# Patient Record
Sex: Female | Born: 1935 | Race: White | Hispanic: No | Smoking: Never smoker
Health system: Southern US, Community
[De-identification: ages and names within clinical notes are randomized; demographics above are authoritative.]

## PROBLEM LIST (undated history)

## (undated) DIAGNOSIS — M810 Age-related osteoporosis without current pathological fracture: Secondary | ICD-10-CM

## (undated) DIAGNOSIS — E079 Disorder of thyroid, unspecified: Secondary | ICD-10-CM

## (undated) DIAGNOSIS — R413 Other amnesia: Secondary | ICD-10-CM

## (undated) DIAGNOSIS — I10 Essential (primary) hypertension: Secondary | ICD-10-CM

## (undated) DIAGNOSIS — E7800 Pure hypercholesterolemia, unspecified: Secondary | ICD-10-CM

## (undated) DIAGNOSIS — I2699 Other pulmonary embolism without acute cor pulmonale: Secondary | ICD-10-CM

## (undated) HISTORY — PX: ABDOMINAL HYSTERECTOMY: SHX81

## (undated) HISTORY — PX: CHOLECYSTECTOMY: SHX55

## (undated) HISTORY — PX: PARATHYROIDECTOMY: SHX19

---

## 1998-12-30 ENCOUNTER — Ambulatory Visit (HOSPITAL_COMMUNITY): Admission: RE | Admit: 1998-12-30 | Discharge: 1998-12-30 | Payer: Self-pay | Admitting: Ophthalmology

## 2007-07-01 ENCOUNTER — Emergency Department (HOSPITAL_COMMUNITY): Admission: EM | Admit: 2007-07-01 | Discharge: 2007-07-01 | Payer: Self-pay | Admitting: Emergency Medicine

## 2009-09-24 ENCOUNTER — Ambulatory Visit (HOSPITAL_COMMUNITY): Admission: RE | Admit: 2009-09-24 | Discharge: 2009-09-24 | Payer: Self-pay | Admitting: Internal Medicine

## 2010-01-26 ENCOUNTER — Emergency Department (HOSPITAL_COMMUNITY): Admission: EM | Admit: 2010-01-26 | Discharge: 2010-01-26 | Payer: Self-pay | Admitting: Emergency Medicine

## 2010-10-16 ENCOUNTER — Ambulatory Visit (HOSPITAL_COMMUNITY): Payer: Medicare Other | Attending: Internal Medicine

## 2010-10-16 DIAGNOSIS — M810 Age-related osteoporosis without current pathological fracture: Secondary | ICD-10-CM | POA: Insufficient documentation

## 2011-01-15 ENCOUNTER — Ambulatory Visit (HOSPITAL_COMMUNITY)
Admission: RE | Admit: 2011-01-15 | Discharge: 2011-01-15 | Disposition: A | Discharge status: Home / Self Care | Payer: Medicare Other | Source: Ambulatory Visit | Attending: Endocrinology | Admitting: Endocrinology

## 2011-01-15 ENCOUNTER — Other Ambulatory Visit (HOSPITAL_COMMUNITY): Payer: Self-pay | Admitting: Endocrinology

## 2011-01-15 DIAGNOSIS — M79609 Pain in unspecified limb: Secondary | ICD-10-CM | POA: Insufficient documentation

## 2011-01-15 DIAGNOSIS — R52 Pain, unspecified: Secondary | ICD-10-CM

## 2011-01-15 DIAGNOSIS — M7730 Calcaneal spur, unspecified foot: Secondary | ICD-10-CM | POA: Insufficient documentation

## 2012-01-22 ENCOUNTER — Emergency Department (HOSPITAL_COMMUNITY)
Admission: EM | Admit: 2012-01-22 | Discharge: 2012-01-22 | Disposition: A | Discharge status: Home / Self Care | Payer: Medicare Other | Attending: Emergency Medicine | Admitting: Emergency Medicine

## 2012-01-22 ENCOUNTER — Encounter (HOSPITAL_COMMUNITY): Payer: Self-pay | Admitting: Emergency Medicine

## 2012-01-22 ENCOUNTER — Emergency Department (HOSPITAL_COMMUNITY): Payer: Medicare Other

## 2012-01-22 DIAGNOSIS — W19XXXA Unspecified fall, initial encounter: Secondary | ICD-10-CM | POA: Insufficient documentation

## 2012-01-22 DIAGNOSIS — Z79899 Other long term (current) drug therapy: Secondary | ICD-10-CM | POA: Insufficient documentation

## 2012-01-22 DIAGNOSIS — R269 Unspecified abnormalities of gait and mobility: Secondary | ICD-10-CM | POA: Insufficient documentation

## 2012-01-22 DIAGNOSIS — Z7901 Long term (current) use of anticoagulants: Secondary | ICD-10-CM | POA: Insufficient documentation

## 2012-01-22 DIAGNOSIS — Z86711 Personal history of pulmonary embolism: Secondary | ICD-10-CM | POA: Insufficient documentation

## 2012-01-22 DIAGNOSIS — M545 Low back pain, unspecified: Secondary | ICD-10-CM | POA: Insufficient documentation

## 2012-01-22 DIAGNOSIS — E079 Disorder of thyroid, unspecified: Secondary | ICD-10-CM | POA: Insufficient documentation

## 2012-01-22 DIAGNOSIS — G319 Degenerative disease of nervous system, unspecified: Secondary | ICD-10-CM | POA: Insufficient documentation

## 2012-01-22 DIAGNOSIS — M25559 Pain in unspecified hip: Secondary | ICD-10-CM | POA: Insufficient documentation

## 2012-01-22 HISTORY — DX: Other pulmonary embolism without acute cor pulmonale: I26.99

## 2012-01-22 HISTORY — DX: Disorder of thyroid, unspecified: E07.9

## 2012-01-22 NOTE — ED Provider Notes (Signed)
History     CSN: [id number]  Arrival date & time 01/22/12  1000   First MD Initiated Contact with Patient 01/22/12 1023      Chief Complaint  Patient presents with  . Fall    (Consider location/radiation/quality/duration/timing/severity/associated sxs/prior treatment) HPI Patient presents with complaint of fall today. She states that she was walking to the bathroom and felt unsteady on her feet and this caused her to fall. She states she struck her head on a But denies having loss of consciousness. Her complaint is of pain in her lower back worse on the right side. She denies any chest pain fainting or palpitations or severe headache prior to the fall.  She has been able to bear weight since the fall.  Pain in back is worse with movement and palpation. She does take Coumadin. She denies any neck pain.  There are no other associated systemic symptoms, there are no other alleviating or modifying factors.   Past Medical History  Diagnosis Date  . Pulmonary emboli   . Thyroid disease     Past Surgical History  Procedure Date  . Parathyroidectomy   . Cholecystectomy   . Abdominal hysterectomy     No family history on file.  History  Substance Use Topics  . Smoking status: Never Smoker   . Smokeless tobacco: Never Used  . Alcohol Use: No    OB History    Grav Para Term Preterm Abortions TAB SAB Ect Mult Living                  Review of Systems ROS reviewed and all otherwise negative except for mentioned in HPI  Allergies  Review of patient's allergies indicates no known allergies.  Home Medications   Current Outpatient Rx  Name Route Sig Dispense Refill  . ACETAMINOPHEN 500 MG PO TABS Oral Take 1,000 mg by mouth every 6 (six) hours as needed. pain    . BUPROPION HCL ER (SR) 100 MG PO TB12 Oral Take 100 mg by mouth daily.    Marland Kitchen CALCIUM GLUCONATE 500 MG PO TABS Oral Take 500 mg by mouth 2 (two) times daily.    . DONEPEZIL HCL 10 MG PO TABS Oral Take 10 mg by mouth  at bedtime.    Marland Kitchen ESCITALOPRAM OXALATE 10 MG PO TABS Oral Take 10 mg by mouth daily.    Marland Kitchen GEMFIBROZIL 600 MG PO TABS Oral Take 600 mg by mouth 2 (two) times daily before a meal.    . LEVOTHYROXINE SODIUM 50 MCG PO TABS Oral Take 50 mcg by mouth daily.    . OCUVITE-LUTEIN PO CAPS Oral Take 2 capsules by mouth daily.    Marland Kitchen OMEPRAZOLE 20 MG PO CPDR Oral Take 20 mg by mouth daily.    Marland Kitchen POTASSIUM CHLORIDE CRYS ER 20 MEQ PO TBCR Oral Take 20 mEq by mouth 2 (two) times daily.    . TRAVOPROST (BAK FREE) 0.004 % OP SOLN Both Eyes Place 1 drop into both eyes at bedtime.    . TRIAMTERENE-HCTZ 37.5-25 MG PO TABS Oral Take 1 tablet by mouth daily.    Marland Kitchen VITAMIN B-12 500 MCG PO TABS Oral Take 500 mcg by mouth daily.    Marland Kitchen VITAMIN D (ERGOCALCIFEROL) 50000 UNITS PO CAPS Oral Take 50,000 Units by mouth every 7 (seven) days. sundays    . WARFARIN SODIUM 5 MG PO TABS Oral Take 2.5-5 mg by mouth daily. Takes 1/2 tablet(2.5mg ) daily except Friday takes 1 tablet(5mg )  BP 138/55  Pulse 73  Temp 97.7 F (36.5 C) (Oral)  Resp 18  SpO2 97% Vitals reviewed Physical Exam Physical Examination: General appearance - alert, well appearing, and in no distress Mental status - alert, oriented to person, place, and time Eyes - EOMI, no conjunctival injection Mouth - mucous membranes moist, pharynx normal without lesions Chest - clear to auscultation, no wheezes, rales or rhonchi, symmetric air entry Heart - normal rate, regular rhythm, normal S1, S2, no murmurs, rubs, clicks or gallops Abdomen - soft, nontender, nondistended, no masses or organomegaly Neurological - alert, oriented, normal speech, strength 5/5 in extremities x 4, sensation intact Back- no midline thoracic tenderness or cervical tenderness, mild lumbar tenderness to palpation, mild paraspinal tenderness on right side- also some pain over lying right posterior hip Musculoskeletal - some pain with palpation over posterior right hip, mild pain with ROM of  right hip, otherwise no joint tenderness, deformity or swelling Extremities - peripheral pulses normal, no pedal edema, no clubbing or cyanosis Skin - normal coloration and turgor, no rashes  ED Course  Procedures (including critical care time)  1:46 PM xray results and images reviewed by me- pt has no point tenderness at T11  Labs Reviewed - No data to display Dg Lumbar Spine Complete  01/22/2012  *RADIOLOGY REPORT*  Clinical Data: Fall with low back pain.  LUMBAR SPINE - COMPLETE 4+ VIEW  Comparison: None.  Findings: AP, lateral and oblique images of the lumbar spine were obtained.  Multilevel degenerative changes, particularly on the right side.  There is a compression fracture at T11.  The T11 vertebral body is dense and this could be a chronic fracture. Severe degenerative endplate changes at L1-L2.  IMPRESSION: Compression fracture of the T11 vertebral body.  The age of the fracture is unknown but could be chronic.  Multilevel degenerative changes in the lumbar spine.  Original Report Authenticated By: Richarda Overlie, M.D.   Dg Hip Complete Right  01/22/2012  *RADIOLOGY REPORT*  Clinical Data: Fall and right hip pain.  RIGHT HIP - COMPLETE 2+ VIEW  Comparison: None  Findings: Sclerosis of the pubic symphysis.  Symmetric appearance of the SI joints.  Pubic rami are intact.  The right hip is located without acute fracture.  Scattered phleboliths in the pelvis. There is a surgical clip within the pelvis.  IMPRESSION: No acute bony abnormality.  Original Report Authenticated By: Richarda Overlie, M.D.   Ct Head Wo Contrast  01/22/2012  *RADIOLOGY REPORT*  Clinical Data: Fall and hit head.  The patient is on blood thinners.  CT HEAD WITHOUT CONTRAST  Technique:  Contiguous axial images were obtained from the base of the skull through the vertex without contrast.  Comparison: None.  Findings: There is diffuse cerebral atrophy.  There is low density in the periventricular white matter suggesting chronic changes.  Marked atrophy of the cerebellum.  No acute bony abnormality. There is marked hyperostosis in the frontal bones. No evidence for acute hemorrhage, mass lesion, midline shift, hydrocephalus or large infarct.  IMPRESSION: No acute intracranial abnormality.  Diffuse atrophy and evidence for chronic small vessel ischemic changes.  Original Report Authenticated By: Richarda Overlie, M.D.     1. Fall   2. Low back pain       MDM  Pt presents with c/o low back pain and right hip pain after mechanical fall today.  xrays reveal prior T11 compression fracture which family states is old and patient is having no pain there today- otherwise  imaging is reassuring.  Head CT obtained due to patient being on coumadin, but no acute abnormality.  Discharged with strict return precautions.  Pt agreeable with plan.        Ethelda Chick, MD 01/23/12 1122

## 2012-01-22 NOTE — ED Notes (Signed)
Pt c/o mild nausea

## 2012-01-22 NOTE — ED Notes (Signed)
Pt fell this morning on way to bathroom, hit head on cabinet but was not knocked out. Pt is c/o pain in lower back, landed on knees but they only hurt d/t arthritis.

## 2012-02-09 ENCOUNTER — Other Ambulatory Visit (HOSPITAL_COMMUNITY): Payer: Self-pay | Admitting: *Deleted

## 2012-02-14 ENCOUNTER — Encounter (HOSPITAL_COMMUNITY)
Admission: RE | Admit: 2012-02-14 | Discharge: 2012-02-14 | Disposition: A | Discharge status: Home / Self Care | Payer: Medicare Other | Source: Ambulatory Visit | Attending: Internal Medicine | Admitting: Internal Medicine

## 2012-02-14 ENCOUNTER — Encounter (HOSPITAL_COMMUNITY): Payer: Self-pay

## 2012-02-14 DIAGNOSIS — M810 Age-related osteoporosis without current pathological fracture: Secondary | ICD-10-CM | POA: Insufficient documentation

## 2012-02-14 HISTORY — DX: Other amnesia: R41.3

## 2012-02-14 HISTORY — DX: Essential (primary) hypertension: I10

## 2012-02-14 HISTORY — DX: Pure hypercholesterolemia, unspecified: E78.00

## 2012-02-14 HISTORY — DX: Age-related osteoporosis without current pathological fracture: M81.0

## 2012-02-14 MED ORDER — ZOLEDRONIC ACID 5 MG/100ML IV SOLN
5.0000 mg | Freq: Once | INTRAVENOUS | Status: AC
  Administered 2012-02-14: 5 mg via INTRAVENOUS
  Filled 2012-02-14: qty 100

## 2012-02-14 MED ORDER — SODIUM CHLORIDE 0.9 % IV SOLN
Freq: Once | INTRAVENOUS | Status: AC
  Administered 2012-02-14: 250 mL via INTRAVENOUS

## 2013-08-31 ENCOUNTER — Encounter: Payer: Self-pay | Admitting: Podiatrist

## 2013-08-31 ENCOUNTER — Ambulatory Visit (INDEPENDENT_AMBULATORY_CARE_PROVIDER_SITE_OTHER): Payer: Commercial Managed Care - HMO | Admitting: Podiatrist

## 2013-08-31 VITALS — BP 138/80 | HR 91 | Resp 18 | Ht 62.0 in | Wt 165.0 lb

## 2013-08-31 DIAGNOSIS — M79609 Pain in unspecified limb: Secondary | ICD-10-CM

## 2013-08-31 DIAGNOSIS — B351 Tinea unguium: Secondary | ICD-10-CM

## 2013-08-31 NOTE — Progress Notes (Signed)
   Subjective:    Patient ID: Gail Schneider, female    DOB: [birth year], 80 y.o.   MRN: [id number] Pt presents with painful B/L hallux toenail Left > right, for about 6 week. Both 1st toenails are thick and discolored and the right is painful HPI    Review of Systems  HENT: Positive for hearing loss.        Macular degeneration, glaucoma and cataracts  All other systems reviewed and are negative.       Objective:   Physical Exam GENERAL APPEARANCE: Alert, conversant. No acute distress.  VASCULAR: Pedal pulses palpable at 1/4 DP and PT bilateral.  Capillary refill time is immediate to all digits,  Proximal to distal cooling it warm to warm.   NEUROLOGIC: sensation is intact epicritically and protectively to 5.07 monofilament at 5/5 sites bilateral.  Light touch is intact bilateral, vibratory sensation intact bilateral,  MUSCULOSKELETAL: acceptable muscle strength, tone and stability bilateral.  Intrinsic muscluature intact bilateral.   DERMATOLOGIC: Bilateral hallux nails are thickened, discolored, dystrophic, yellowed, ingrown on the medial and lateral nail borders and painful. No redness, swelling or paronychia is present. Otherwise skin color, texture, and turger are within normal limits.  No preulcerative lesions are seen, no interdigital maceration noted.  No open lesions present.  Remainder of Digital nails are asymptomatic.     Assessment & Plan:  Mycotic toenails x 2  Plan: I debrided the mycotic toenails. Recommended against any type of antifungal oral medication due to her age. She'll be seen back routinely for debridements. Of note I lightly debrided today due to her discomfort. Hopefully next time I can do more.  Debridement carried out without complication-- will return in 3 months or as needed

## 2013-11-30 ENCOUNTER — Ambulatory Visit (INDEPENDENT_AMBULATORY_CARE_PROVIDER_SITE_OTHER): Payer: Commercial Managed Care - HMO | Admitting: Podiatrist

## 2013-11-30 DIAGNOSIS — M79609 Pain in unspecified limb: Secondary | ICD-10-CM

## 2013-11-30 DIAGNOSIS — B351 Tinea unguium: Secondary | ICD-10-CM | POA: Diagnosis not present

## 2013-11-30 DIAGNOSIS — M79673 Pain in unspecified foot: Secondary | ICD-10-CM

## 2013-11-30 NOTE — Progress Notes (Signed)
   Subjective:    Patient ID: Gail Schneider, female    DOB: 09-26-1917, 80 y.o.   MRN: [id number]  HPI Pt is here for routine debride. No other issues at this moment   Review of Systems     Objective:   Physical Exam Patient is awake, alert, and oriented x 3.  In no acute distress.  Vascular status is intact with palpable pedal pulses at 2/4 DP and PT bilateral and capillary refill time within normal limits. Neurological sensation is also intact bilaterally via Semmes Weinstein monofilament at 5/5 sites. Light touch, vibratory sensation, Achilles tendon reflex is intact. Dermatological exam reveals thick, and painful bilateral hallux nails which are elongated.  Other nails are able to be trimmed by the patient.  otherwise skin color, turger and texture as normal. No open lesions present.  Musculature intact with dorsiflexion, plantarflexion, inversion, eversion.    Assessment & Plan:  Symptomatic mycotic toenails x 2  Plan:  Debridement of the mycotic toenails accomplished today without complication. She will return in 3 months or as needed for follow up.

## 2014-06-01 ENCOUNTER — Emergency Department (HOSPITAL_COMMUNITY)
Admission: EM | Admit: 2014-06-01 | Discharge: 2014-06-01 | Disposition: A | Discharge status: Home / Self Care | Payer: Medicare PPO | Attending: Emergency Medicine | Admitting: Emergency Medicine

## 2014-06-01 ENCOUNTER — Encounter (HOSPITAL_COMMUNITY): Payer: Self-pay | Admitting: Adult Health

## 2014-06-01 ENCOUNTER — Emergency Department (HOSPITAL_COMMUNITY): Payer: Medicare PPO

## 2014-06-01 DIAGNOSIS — W19XXXA Unspecified fall, initial encounter: Secondary | ICD-10-CM

## 2014-06-01 DIAGNOSIS — M25521 Pain in right elbow: Secondary | ICD-10-CM

## 2014-06-01 DIAGNOSIS — Z7982 Long term (current) use of aspirin: Secondary | ICD-10-CM | POA: Diagnosis not present

## 2014-06-01 DIAGNOSIS — I10 Essential (primary) hypertension: Secondary | ICD-10-CM | POA: Insufficient documentation

## 2014-06-01 DIAGNOSIS — Y998 Other external cause status: Secondary | ICD-10-CM | POA: Diagnosis not present

## 2014-06-01 DIAGNOSIS — Y92031 Bathroom in apartment as the place of occurrence of the external cause: Secondary | ICD-10-CM | POA: Insufficient documentation

## 2014-06-01 DIAGNOSIS — E782 Mixed hyperlipidemia: Secondary | ICD-10-CM | POA: Insufficient documentation

## 2014-06-01 DIAGNOSIS — R197 Diarrhea, unspecified: Secondary | ICD-10-CM | POA: Diagnosis not present

## 2014-06-01 DIAGNOSIS — M810 Age-related osteoporosis without current pathological fracture: Secondary | ICD-10-CM | POA: Diagnosis not present

## 2014-06-01 DIAGNOSIS — Z86711 Personal history of pulmonary embolism: Secondary | ICD-10-CM | POA: Diagnosis not present

## 2014-06-01 DIAGNOSIS — W01198A Fall on same level from slipping, tripping and stumbling with subsequent striking against other object, initial encounter: Secondary | ICD-10-CM | POA: Diagnosis not present

## 2014-06-01 DIAGNOSIS — E079 Disorder of thyroid, unspecified: Secondary | ICD-10-CM | POA: Diagnosis not present

## 2014-06-01 DIAGNOSIS — S5001XA Contusion of right elbow, initial encounter: Secondary | ICD-10-CM

## 2014-06-01 DIAGNOSIS — Y9389 Activity, other specified: Secondary | ICD-10-CM | POA: Diagnosis not present

## 2014-06-01 DIAGNOSIS — S59901A Unspecified injury of right elbow, initial encounter: Secondary | ICD-10-CM | POA: Diagnosis not present

## 2014-06-01 NOTE — ED Notes (Signed)
Pt family member at bedside. Pt alert and oriented, denies any other injuries

## 2014-06-01 NOTE — ED Provider Notes (Signed)
CSN: [id number]     Arrival date & time 06/01/14  1711 History  This chart was scribed for non-physician practitioner working with No att. providers found by Elveria Risingimelie Horne, ED Scribe. This patient was seen in room TR10C/TR10C and the patient's care was started at 7:19 PM.   Chief Complaint  Patient presents with  . Elbow Injury   The history is provided by the patient and a relative. No language interpreter was used.   HPI Comments: Gail Schneider is a 80 y.o. female who presents to the Emergency Department with right elbow injury incurred due to a fall two nights ago. Patient reports stumbling while on her way to bathroom and hitting her right elbow on a plastic container. Patient has been experiencing pain, bruising and continued swelling. Patient denies pain in her right shoulder. Daughter reports that the patient last received Tylenol four ago. Patient denies numbness, tingling, weakness, loss of sensation or function.  Past Medical History  Diagnosis Date  . Pulmonary emboli   . Thyroid disease   . Osteoporosis   . Memory impairment   . Hypertension   . Hypercholesteremia    Past Surgical History  Procedure Laterality Date  . Parathyroidectomy    . Cholecystectomy    . Abdominal hysterectomy     History reviewed. No pertinent family history. History  Substance Use Topics  . Smoking status: Never Smoker   . Smokeless tobacco: Never Used  . Alcohol Use: No   OB History    No data available     Review of Systems  Constitutional: Negative for chills.  Gastrointestinal: Positive for diarrhea.  Musculoskeletal: Positive for joint swelling and arthralgias.  Skin: Positive for color change.  Neurological: Negative for weakness and numbness.      Allergies  Review of patient's allergies indicates no known allergies.  Home Medications   Prior to Admission medications   Medication Sig Start Date End Date Taking? Authorizing Provider  acetaminophen (TYLENOL) 500 MG  tablet Take 1,000 mg by mouth every 6 (six) hours as needed. pain    Historical Provider, MD  aspirin 81 MG tablet Take 81 mg by mouth daily.    Historical Provider, MD  buPROPion (WELLBUTRIN SR) 100 MG 12 hr tablet Take 100 mg by mouth daily.    Historical Provider, MD  calcium gluconate 500 MG tablet Take 500 mg by mouth 2 (two) times daily.    Historical Provider, MD  diclofenac (FLECTOR) 1.3 % PTCH Place 1 patch onto the skin 2 (two) times daily.    Historical Provider, MD  donepezil (ARICEPT) 10 MG tablet Take 10 mg by mouth at bedtime.    Historical Provider, MD  escitalopram (LEXAPRO) 10 MG tablet Take 10 mg by mouth daily.    Historical Provider, MD  gemfibrozil (LOPID) 600 MG tablet Take 600 mg by mouth 2 (two) times daily before a meal.    Historical Provider, MD  levothyroxine (SYNTHROID, LEVOTHROID) 50 MCG tablet Take 50 mcg by mouth daily.    Historical Provider, MD  lidocaine (LIDODERM) 5 % Place 1 patch onto the skin daily. Remove & Discard patch within 12 hours or as directed by MD    Historical Provider, MD  multivitamin-lutein (OCUVITE-LUTEIN) CAPS Take 2 capsules by mouth daily.    Historical Provider, MD  nystatin (MYCOSTATIN) powder Apply topically 4 (four) times daily.    Historical Provider, MD  omeprazole (PRILOSEC) 20 MG capsule Take 20 mg by mouth daily.    Historical Provider, MD  potassium chloride SA (K-DUR,KLOR-CON) 20 MEQ tablet Take 20 mEq by mouth 2 (two) times daily.    Historical Provider, MD  Travoprost, BAK Free, (TRAVATAN) 0.004 % SOLN ophthalmic solution Place 1 drop into both eyes at bedtime.    Historical Provider, MD  triamcinolone cream (KENALOG) 0.1 % Apply 1 application topically 2 (two) times daily.    Historical Provider, MD  triamterene-hydrochlorothiazide (MAXZIDE-25) 37.5-25 MG per tablet Take 1 tablet by mouth daily.    Historical Provider, MD  vitamin B-12 (CYANOCOBALAMIN) 500 MCG tablet Take 500 mcg by mouth daily.    Historical Provider, MD   Vitamin D, Ergocalciferol, (DRISDOL) 50000 UNITS CAPS Take 50,000 Units by mouth every 7 (seven) days. sundays    Historical Provider, MD  zoledronic acid (RECLAST) 5 MG/100ML SOLN injection Inject 5 mg into the vein once.    Historical Provider, MD   Triage Vitals: BP 121/85 mmHg  Pulse 86  Temp(Src) 98.7 F (37.1 C) (Oral)  Resp 18  SpO2 96% Physical Exam  Constitutional: She is oriented to person, place, and time. She appears well-developed and well-nourished. No distress.  HENT:  Head: Normocephalic and atraumatic.  Eyes: EOM are normal.  Neck: Neck supple. No tracheal deviation present.  Cardiovascular: Normal rate.   Pulmonary/Chest: Effort normal. No respiratory distress.  Musculoskeletal: Normal range of motion.  Moderate amount of swelling and ecchymosis on right elbow. posteriorally with1 x 1 cm abrasion. No ob def. full active and passive range of motion without pain. Motor strength 5 out of 5 at shoulder, elbow, wrist. Radial pulse 2+. Capillary refill less than 2 seconds distally. Distal sensation intact.  Neurological: She is alert and oriented to person, place, and time.  Skin: Skin is warm and dry.  Psychiatric: She has a normal mood and affect. Her behavior is normal.  Nursing note and vitals reviewed.   ED Course  Procedures (including critical care time)  COORDINATION OF CARE: 5:00 AM- Discussed treatment plan with patient at bedside and patient agreed to plan.   Labs Review Labs Reviewed - No data to display  Imaging Review Dg Elbow Complete Right  06/01/2014   CLINICAL DATA:  Fall in bathroom 3 days ago  EXAM: RIGHT ELBOW - COMPLETE 3+ VIEW  COMPARISON:  None.  FINDINGS: No evidence of fracture of the ulna or humerus. The radial head demonstrates osteophytosis without evidence of fracture. No joint effusion.  IMPRESSION: No fracture or dislocation.   Electronically Signed   By: Genevive BiStewart  Edmunds M.D.   On: 06/01/2014 20:56     EKG Interpretation None       MDM   Final diagnoses:  Elbow pain, right  Elbow contusion, right, initial encounter    Patient here with swelling to her right elbow that is post fall 2 nights ago. Patient denying any loss of consciousness or other complaints. Patient neurovascularly intact, with mild tenderness noted on exam and in moderate amount of swelling noted on exam. Radiographs show no evidence of fracture of the ulnar humerus, radial head demonstrates osteophytosis without evidence of fracture. No obvious joint effusion noted. I discussed RICE therapy with patient, and encourage her to follow-up with orthopedics should her symptoms persist. I also encouraged patient to follow up with her primary care physician. I discussed return precautions with patient and patient was agreeable to this plan. I encouraged patient to call or return to the ER should she have any questions or concerns.  I personally performed the services described in this documentation,  which was scribed in my presence. The recorded information has been reviewed and is accurate.  BP 129/99 mmHg  Pulse 84  Temp(Src) 97.4 F (36.3 C) (Oral)  Resp 18  SpO2 95%  Signed,  Ladona Mow, PA-C 5:00 AM  Patient seen and discussed with Dr. Linwood Dibbles, M.D.   Monte Fantasia, PA-C 06/02/14 0500  Linwood Dibbles, MD 06/04/14 [phone]

## 2014-06-01 NOTE — ED Notes (Signed)
PA at the bedside.

## 2014-06-01 NOTE — Discharge Instructions (Signed)
Follow-up with your primary care doctor. Follow-up with orthopedics if your symptoms are persisting. Return to the ER if your symptoms are worsening, or if you develop any severe swelling, redness, warmth, high fever  Elbow Contusion An elbow contusion is a deep bruise of the elbow. Contusions are the result of an injury that caused bleeding under the skin. The contusion may turn blue, purple, or yellow. Minor injuries will give you a painless contusion, but more severe contusions may stay painful and swollen for a few weeks.  CAUSES  An elbow contusion comes from a direct force to that area, such as falling on the elbow. SYMPTOMS   Swelling and redness of the elbow.  Bruising of the elbow area.  Tenderness or soreness of the elbow. DIAGNOSIS  You will have a physical exam and will be asked about your history. You may need an X-ray of your elbow to look for a broken bone (fracture).  TREATMENT  A sling or splint may be needed to support your injury. Resting, elevating, and applying cold compresses to the elbow area are often the best treatments for an elbow contusion. Over-the-counter medicines may also be recommended for pain control. HOME CARE INSTRUCTIONS   Put ice on the injured area.  Put ice in a plastic bag.  Place a towel between your skin and the bag.  Leave the ice on for 15-20 minutes, 03-04 times a day.  Only take over-the-counter or prescription medicines for pain, discomfort, or fever as directed by your caregiver.  Rest your injured elbow until the pain and swelling are better.  Elevate your elbow to reduce swelling.  Apply a compression wrap as directed by your caregiver. This can help reduce swelling and motion. You may remove the wrap for sleeping, showers, and baths. If your fingers become numb, cold, or blue, take the wrap off and reapply it more loosely.  Use your elbow only as directed by your caregiver. You may be asked to do range of motion exercises. Do  them as directed.  See your caregiver as directed. It is very important to keep all follow-up appointments in order to avoid any long-term problems with your elbow, including chronic pain or inability to move your elbow normally. SEEK IMMEDIATE MEDICAL CARE IF:   You have increased redness, swelling, or pain in your elbow.  Your swelling or pain is not relieved with medicines.  You have swelling of the hand and fingers.  You are unable to move your fingers or wrist.  You begin to lose feeling in your hand or fingers.  Your fingers or hand become cold or blue. MAKE SURE YOU:   Understand these instructions.  Will watch your condition.  Will get help right away if you are not doing well or get worse. Document Released: 05/02/2006 Document Revised: 08/16/2011 Document Reviewed: 04/09/2011 [facility] Patient Information 2015 [place], [place]. This information is not intended to replace advice given to you by your health care provider. Make sure you discuss any questions you have with your health care provider.

## 2014-06-01 NOTE — ED Notes (Signed)
Presents with right elbow pain, bruising and swelling from a fall that occurred 2 nights ago. Arm is continuing to swell.pt has full range of motion. denis LOC, pt tripped. denis other injury.

## 2014-07-02 ENCOUNTER — Other Ambulatory Visit (HOSPITAL_COMMUNITY): Payer: Self-pay | Admitting: Internal Medicine

## 2014-07-08 ENCOUNTER — Ambulatory Visit (HOSPITAL_COMMUNITY)
Admission: RE | Admit: 2014-07-08 | Discharge: 2014-07-08 | Disposition: A | Discharge status: Home / Self Care | Payer: Medicare PPO | Source: Ambulatory Visit | Attending: Internal Medicine | Admitting: Internal Medicine

## 2014-07-08 DIAGNOSIS — M810 Age-related osteoporosis without current pathological fracture: Secondary | ICD-10-CM | POA: Diagnosis present

## 2014-07-08 MED ORDER — SODIUM CHLORIDE 0.9 % IV SOLN
INTRAVENOUS | Status: DC
  Administered 2014-07-08: 250 mL via INTRAVENOUS

## 2014-07-08 MED ORDER — ZOLEDRONIC ACID 5 MG/100ML IV SOLN
5.0000 mg | Freq: Once | INTRAVENOUS | Status: AC
  Administered 2014-07-08: 5 mg via INTRAVENOUS
  Filled 2014-07-08: qty 100

## 2014-07-08 NOTE — Discharge Instructions (Signed)
Drink fluids/water as tolerated over next 72hrs °Tylenol or Ibuprofen OTC as directed °Continue calcium and Vit D as directed by your MDZoledronic Acid injection (Paget's Disease, Osteoporosis) °What is this medicine? °ZOLEDRONIC ACID (ZOE le dron ik AS id) lowers the amount of calcium loss from bone. It is used to treat Paget's disease and osteoporosis in women. °This medicine may be used for other purposes; ask your health care provider or pharmacist if you have questions. °COMMON BRAND NAME(S): Reclast, Zometa °What should I tell my health care provider before I take this medicine? °They need to know if you have any of these conditions: °-aspirin-sensitive asthma °-cancer, especially if you are receiving medicines used to treat cancer °-dental disease or wear dentures °-infection °-kidney disease °-low levels of calcium in the blood °-past surgery on the parathyroid gland or intestines °-receiving corticosteroids like dexamethasone or prednisone °-an unusual or allergic reaction to zoledronic acid, other medicines, foods, dyes, or preservatives °-pregnant or trying to get pregnant °-breast-feeding °How should I use this medicine? °This medicine is for infusion into a vein. It is given by a health care professional in a hospital or clinic setting. °Talk to your pediatrician regarding the use of this medicine in children. This medicine is not approved for use in children. °Overdosage: If you think you have taken too much of this medicine contact a poison control center or emergency room at once. °NOTE: This medicine is only for you. Do not share this medicine with others. °What if I miss a dose? °It is important not to miss your dose. Call your doctor or health care professional if you are unable to keep an appointment. °What may interact with this medicine? °-certain antibiotics given by injection °-NSAIDs, medicines for pain and inflammation, like ibuprofen or naproxen °-some diuretics like bumetanide,  furosemide °-teriparatide °This list may not describe all possible interactions. Give your health care provider a list of all the medicines, herbs, non-prescription drugs, or dietary supplements you use. Also tell them if you smoke, drink alcohol, or use illegal drugs. Some items may interact with your medicine. °What should I watch for while using this medicine? °Visit your doctor or health care professional for regular checkups. It may be some time before you see the benefit from this medicine. Do not stop taking your medicine unless your doctor tells you to. Your doctor may order blood tests or other tests to see how you are doing. °Women should inform their doctor if they wish to become pregnant or think they might be pregnant. There is a potential for serious side effects to an unborn child. Talk to your health care professional or pharmacist for more information. °You should make sure that you get enough calcium and vitamin D while you are taking this medicine. Discuss the foods you eat and the vitamins you take with your health care professional. °Some people who take this medicine have severe bone, joint, and/or muscle pain. This medicine may also increase your risk for jaw problems or a broken thigh bone. Tell your doctor right away if you have severe pain in your jaw, bones, joints, or muscles. Tell your doctor if you have any pain that does not go away or that gets worse. °Tell your dentist and dental surgeon that you are taking this medicine. You should not have major dental surgery while on this medicine. See your dentist to have a dental exam and fix any dental problems before starting this medicine. Take good care of your teeth while on   this medicine. Make sure you see your dentist for regular follow-up appointments. °What side effects may I notice from receiving this medicine? °Side effects that you should report to your doctor or health care professional as soon as possible: °-allergic reactions  like skin rash, itching or hives, swelling of the face, lips, or tongue °-anxiety, confusion, or depression °-breathing problems °-changes in vision °-eye pain °-feeling faint or lightheaded, falls °-jaw pain, especially after dental work °-mouth sores °-muscle cramps, stiffness, or weakness °-trouble passing urine or change in the amount of urine °Side effects that usually do not require medical attention (report to your doctor or health care professional if they continue or are bothersome): °-bone, joint, or muscle pain °-constipation °-diarrhea °-fever °-hair loss °-irritation at site where injected °-loss of appetite °-nausea, vomiting °-stomach upset °-trouble sleeping °-trouble swallowing °-weak or tired °This list may not describe all possible side effects. Call your doctor for medical advice about side effects. You may report side effects to FDA at 1-800-FDA-1088. °Where should I keep my medicine? °This drug is given in a hospital or clinic and will not be stored at home. °NOTE: This sheet is a summary. It may not cover all possible information. If you have questions about this medicine, talk to your doctor, pharmacist, or health care provider. °© 2015, [employer]. (2012-11-06 10:03:48) ° °

## 2015-01-11 ENCOUNTER — Encounter (HOSPITAL_COMMUNITY): Payer: Self-pay | Admitting: *Deleted

## 2015-01-11 ENCOUNTER — Emergency Department (HOSPITAL_COMMUNITY): Payer: Medicare PPO

## 2015-01-11 ENCOUNTER — Inpatient Hospital Stay (HOSPITAL_COMMUNITY)
Admission: EM | Admit: 2015-01-11 | Discharge: 2015-01-14 | DRG: 312 | Disposition: A | Discharge status: Home Health | Payer: Medicare PPO | Attending: Family Medicine | Admitting: Family Medicine

## 2015-01-11 DIAGNOSIS — I10 Essential (primary) hypertension: Secondary | ICD-10-CM

## 2015-01-11 DIAGNOSIS — B964 Proteus (mirabilis) (morganii) as the cause of diseases classified elsewhere: Secondary | ICD-10-CM | POA: Diagnosis present

## 2015-01-11 DIAGNOSIS — M810 Age-related osteoporosis without current pathological fracture: Secondary | ICD-10-CM | POA: Diagnosis present

## 2015-01-11 DIAGNOSIS — E861 Hypovolemia: Secondary | ICD-10-CM | POA: Diagnosis present

## 2015-01-11 DIAGNOSIS — Z86711 Personal history of pulmonary embolism: Secondary | ICD-10-CM

## 2015-01-11 DIAGNOSIS — G309 Alzheimer's disease, unspecified: Secondary | ICD-10-CM | POA: Diagnosis present

## 2015-01-11 DIAGNOSIS — Z6827 Body mass index (BMI) 27.0-27.9, adult: Secondary | ICD-10-CM

## 2015-01-11 DIAGNOSIS — Z886 Allergy status to analgesic agent status: Secondary | ICD-10-CM | POA: Diagnosis not present

## 2015-01-11 DIAGNOSIS — H540 Blindness, both eyes: Secondary | ICD-10-CM | POA: Diagnosis present

## 2015-01-11 DIAGNOSIS — G8929 Other chronic pain: Secondary | ICD-10-CM | POA: Diagnosis present

## 2015-01-11 DIAGNOSIS — H3530 Unspecified macular degeneration: Secondary | ICD-10-CM | POA: Diagnosis present

## 2015-01-11 DIAGNOSIS — E871 Hypo-osmolality and hyponatremia: Secondary | ICD-10-CM | POA: Diagnosis present

## 2015-01-11 DIAGNOSIS — R627 Adult failure to thrive: Secondary | ICD-10-CM | POA: Diagnosis present

## 2015-01-11 DIAGNOSIS — Z888 Allergy status to other drugs, medicaments and biological substances status: Secondary | ICD-10-CM

## 2015-01-11 DIAGNOSIS — Z833 Family history of diabetes mellitus: Secondary | ICD-10-CM | POA: Diagnosis not present

## 2015-01-11 DIAGNOSIS — Z7982 Long term (current) use of aspirin: Secondary | ICD-10-CM

## 2015-01-11 DIAGNOSIS — M8580 Other specified disorders of bone density and structure, unspecified site: Secondary | ICD-10-CM | POA: Diagnosis present

## 2015-01-11 DIAGNOSIS — E869 Volume depletion, unspecified: Secondary | ICD-10-CM | POA: Diagnosis present

## 2015-01-11 DIAGNOSIS — R296 Repeated falls: Secondary | ICD-10-CM | POA: Diagnosis present

## 2015-01-11 DIAGNOSIS — N12 Tubulo-interstitial nephritis, not specified as acute or chronic: Secondary | ICD-10-CM | POA: Diagnosis present

## 2015-01-11 DIAGNOSIS — E780 Pure hypercholesterolemia: Secondary | ICD-10-CM | POA: Diagnosis present

## 2015-01-11 DIAGNOSIS — B952 Enterococcus as the cause of diseases classified elsewhere: Secondary | ICD-10-CM | POA: Diagnosis present

## 2015-01-11 DIAGNOSIS — E43 Unspecified severe protein-calorie malnutrition: Secondary | ICD-10-CM | POA: Insufficient documentation

## 2015-01-11 DIAGNOSIS — N390 Urinary tract infection, site not specified: Secondary | ICD-10-CM | POA: Diagnosis present

## 2015-01-11 DIAGNOSIS — I951 Orthostatic hypotension: Secondary | ICD-10-CM | POA: Diagnosis present

## 2015-01-11 DIAGNOSIS — I2699 Other pulmonary embolism without acute cor pulmonale: Secondary | ICD-10-CM | POA: Diagnosis present

## 2015-01-11 DIAGNOSIS — H9190 Unspecified hearing loss, unspecified ear: Secondary | ICD-10-CM | POA: Diagnosis present

## 2015-01-11 DIAGNOSIS — Z66 Do not resuscitate: Secondary | ICD-10-CM | POA: Diagnosis present

## 2015-01-11 DIAGNOSIS — Z79899 Other long term (current) drug therapy: Secondary | ICD-10-CM

## 2015-01-11 DIAGNOSIS — F0280 Dementia in other diseases classified elsewhere without behavioral disturbance: Secondary | ICD-10-CM | POA: Diagnosis present

## 2015-01-11 DIAGNOSIS — E876 Hypokalemia: Secondary | ICD-10-CM | POA: Diagnosis present

## 2015-01-11 DIAGNOSIS — R531 Weakness: Secondary | ICD-10-CM | POA: Diagnosis present

## 2015-01-11 DIAGNOSIS — E079 Disorder of thyroid, unspecified: Secondary | ICD-10-CM | POA: Diagnosis present

## 2015-01-11 DIAGNOSIS — E860 Dehydration: Secondary | ICD-10-CM | POA: Diagnosis present

## 2015-01-11 DIAGNOSIS — E0789 Other specified disorders of thyroid: Secondary | ICD-10-CM | POA: Diagnosis present

## 2015-01-11 LAB — CBC WITH DIFFERENTIAL/PLATELET
BASOS ABS: 0 10*3/uL (ref 0.0–0.1)
Basophils Relative: 0 % (ref 0–1)
EOS ABS: 0 10*3/uL (ref 0.0–0.7)
EOS PCT: 0 % (ref 0–5)
HEMATOCRIT: 36.8 % (ref 36.0–46.0)
Hemoglobin: 12.5 g/dL (ref 12.0–15.0)
LYMPHS ABS: 1.7 10*3/uL (ref 0.7–4.0)
Lymphocytes Relative: 22 % (ref 12–46)
MCH: 28.3 pg (ref 26.0–34.0)
MCHC: 34 g/dL (ref 30.0–36.0)
MCV: 83.4 fL (ref 78.0–100.0)
MONO ABS: 0.7 10*3/uL (ref 0.1–1.0)
MONOS PCT: 9 % (ref 3–12)
NEUTROS ABS: 5.3 10*3/uL (ref 1.7–7.7)
NEUTROS PCT: 69 % (ref 43–77)
PLATELETS: 304 10*3/uL (ref 150–400)
RBC: 4.41 MIL/uL (ref 3.87–5.11)
RDW: 13.1 % (ref 11.5–15.5)
WBC: 7.7 10*3/uL (ref 4.0–10.5)

## 2015-01-11 LAB — URINALYSIS, ROUTINE W REFLEX MICROSCOPIC
Bilirubin Urine: NEGATIVE
GLUCOSE, UA: NEGATIVE mg/dL
Hgb urine dipstick: NEGATIVE
Ketones, ur: NEGATIVE mg/dL
Nitrite: POSITIVE — AB
Protein, ur: NEGATIVE mg/dL
SPECIFIC GRAVITY, URINE: 1.009 (ref 1.005–1.030)
Urobilinogen, UA: 1 mg/dL (ref 0.0–1.0)
pH: 7.5 (ref 5.0–8.0)

## 2015-01-11 LAB — COMPREHENSIVE METABOLIC PANEL
ALBUMIN: 2.9 g/dL — AB (ref 3.5–5.0)
ALT: 13 U/L — ABNORMAL LOW (ref 14–54)
ANION GAP: 12 (ref 5–15)
AST: 29 U/L (ref 15–41)
Alkaline Phosphatase: 179 U/L — ABNORMAL HIGH (ref 38–126)
BILIRUBIN TOTAL: 1 mg/dL (ref 0.3–1.2)
BUN: 19 mg/dL (ref 6–20)
CHLORIDE: 89 mmol/L — AB (ref 101–111)
CO2: 29 mmol/L (ref 22–32)
Calcium: 8.9 mg/dL (ref 8.9–10.3)
Creatinine, Ser: 1.36 mg/dL — ABNORMAL HIGH (ref 0.44–1.00)
GFR calc Af Amer: 37 mL/min — ABNORMAL LOW (ref >60)
GFR calc non Af Amer: 32 mL/min — ABNORMAL LOW (ref >60)
Glucose, Bld: 146 mg/dL — ABNORMAL HIGH (ref 65–99)
Potassium: 2.8 mmol/L — ABNORMAL LOW (ref 3.5–5.1)
SODIUM: 130 mmol/L — AB (ref 135–145)
Total Protein: 7.5 g/dL (ref 6.5–8.1)

## 2015-01-11 LAB — URINE MICROSCOPIC-ADD ON

## 2015-01-11 LAB — I-STAT TROPONIN, ED: TROPONIN I, POC: 0 ng/mL (ref 0.00–0.08)

## 2015-01-11 MED ORDER — ENSURE ENLIVE PO LIQD
237.0000 mL | Freq: Two times a day (BID) | ORAL | Status: DC
  Administered 2015-01-12 – 2015-01-13 (×2): 237 mL via ORAL

## 2015-01-11 MED ORDER — SODIUM CHLORIDE 0.9 % IJ SOLN
3.0000 mL | Freq: Two times a day (BID) | INTRAMUSCULAR | Status: DC
  Administered 2015-01-11 – 2015-01-13 (×5): 3 mL via INTRAVENOUS

## 2015-01-11 MED ORDER — ASPIRIN 81 MG PO CHEW
81.0000 mg | CHEWABLE_TABLET | Freq: Every day | ORAL | Status: DC
  Administered 2015-01-12 – 2015-01-14 (×3): 81 mg via ORAL
  Filled 2015-01-11 (×6): qty 1

## 2015-01-11 MED ORDER — POTASSIUM CHLORIDE 20 MEQ/15ML (10%) PO SOLN
40.0000 meq | Freq: Once | ORAL | Status: AC
  Administered 2015-01-11: 40 meq via ORAL
  Filled 2015-01-11: qty 30

## 2015-01-11 MED ORDER — SODIUM CHLORIDE 0.9 % IV SOLN: INTRAVENOUS | Status: DC

## 2015-01-11 MED ORDER — SODIUM CHLORIDE 0.9 % IV BOLUS (SEPSIS)
1000.0000 mL | Freq: Once | INTRAVENOUS | Status: AC
  Administered 2015-01-11: 1000 mL via INTRAVENOUS

## 2015-01-11 MED ORDER — HEPARIN SODIUM (PORCINE) 5000 UNIT/ML IJ SOLN
5000.0000 [IU] | Freq: Three times a day (TID) | INTRAMUSCULAR | Status: DC
  Administered 2015-01-11 – 2015-01-14 (×8): 5000 [IU] via SUBCUTANEOUS
  Filled 2015-01-11 (×8): qty 1

## 2015-01-11 MED ORDER — MEMANTINE HCL ER 28 MG PO CP24
28.0000 mg | ORAL_CAPSULE | Freq: Every day | ORAL | Status: DC
  Administered 2015-01-11 – 2015-01-13 (×3): 28 mg via ORAL
  Filled 2015-01-11 (×4): qty 1

## 2015-01-11 MED ORDER — CEFTRIAXONE SODIUM 1 G IJ SOLR
1.0000 g | Freq: Once | INTRAMUSCULAR | Status: AC
  Administered 2015-01-11: 1 g via INTRAVENOUS
  Filled 2015-01-11: qty 10

## 2015-01-11 MED ORDER — LEVOTHYROXINE SODIUM 25 MCG PO TABS
50.0000 ug | ORAL_TABLET | Freq: Every day | ORAL | Status: DC
  Administered 2015-01-12 – 2015-01-14 (×3): 50 ug via ORAL
  Filled 2015-01-11 (×3): qty 2

## 2015-01-11 MED ORDER — SODIUM CHLORIDE 0.9 % IV SOLN
Freq: Once | INTRAVENOUS | Status: AC
  Administered 2015-01-11: 12:00:00 via INTRAVENOUS

## 2015-01-11 MED ORDER — DONEPEZIL HCL 5 MG PO TABS
10.0000 mg | ORAL_TABLET | Freq: Two times a day (BID) | ORAL | Status: DC
  Administered 2015-01-11 – 2015-01-14 (×6): 10 mg via ORAL
  Filled 2015-01-11 (×6): qty 2

## 2015-01-11 MED ORDER — ESCITALOPRAM OXALATE 10 MG PO TABS
5.0000 mg | ORAL_TABLET | Freq: Every day | ORAL | Status: DC
  Administered 2015-01-11 – 2015-01-13 (×3): 5 mg via ORAL
  Filled 2015-01-11 (×3): qty 1

## 2015-01-11 MED ORDER — ZOLEDRONIC ACID 5 MG/100ML IV SOLN: 5.0000 mg | Freq: Once | INTRAVENOUS | Status: DC

## 2015-01-11 MED ORDER — BUPROPION HCL ER (SR) 150 MG PO TB12
150.0000 mg | ORAL_TABLET | Freq: Every day | ORAL | Status: DC
  Administered 2015-01-12 – 2015-01-13 (×2): 150 mg via ORAL
  Filled 2015-01-11 (×4): qty 1

## 2015-01-11 MED ORDER — SODIUM CHLORIDE 0.9 % IV SOLN
INTRAVENOUS | Status: DC
  Administered 2015-01-11 – 2015-01-12 (×4): via INTRAVENOUS

## 2015-01-11 NOTE — ED Provider Notes (Signed)
CSN: [id number]     Arrival date & time 01/11/15  1049 History   First MD Initiated Contact with Patient 01/11/15 1052     Chief Complaint  Patient presents with  . Failure To Thrive     (Consider location/radiation/quality/duration/timing/severity/associated sxs/prior Treatment) HPI [place] is a 81 y.o. female with history of prior PE, thyroid disease, hypertension, mild dementia, blindness, presents to emergency department with family from home with complaint of multiple syncopal episodes, weakness, failure to thrive. Patient lives at home on her own house but family stated there someone at the house 24 7 with her. Patient in the last 3 weeks has had gradual decline in health and activity. At this time patient unable to ambulate, where before she was able to ambulate with a walker. Family assist patient from bed to the bathroom, and states each time they put her in the wheelchair to will her to the bathroom she has a syncopal episode. Family states "halfway to the bathroom she just goes limp." Family stated takes patient approximately minute to become alert again. Family stated her last 3 weeks patient has had decreased appetite. Patient reports nothing tastes good. She has decreased by mouth intake. She is also complaining of chronic right knee pain. Family report multiple falls but states that H time they would lower her to the ground so report no possibility of injuries or head trauma. Patient denies any complaints. Patient is DO NOT RESUSCITATE.  Past Medical History  Diagnosis Date  . Pulmonary emboli   . Thyroid disease   . Osteoporosis   . Memory impairment   . Hypertension   . Hypercholesteremia    Past Surgical History  Procedure Laterality Date  . Parathyroidectomy    . Cholecystectomy    . Abdominal hysterectomy     No family history on file. History  Substance Use Topics  . Smoking status: Never Smoker   . Smokeless tobacco: Never Used  . Alcohol Use: No    OB History    No data available     Review of Systems  Unable to perform ROS: Dementia  Constitutional: Positive for appetite change. Negative for fever.  Respiratory: Negative for cough.   Neurological: Positive for syncope and weakness.      Allergies  Boniva; Lipitor; and Nsaids  Home Medications   Prior to Admission medications   Medication Sig Start Date End Date Taking? Authorizing Provider  acetaminophen (TYLENOL) 500 MG tablet Take 1,000 mg by mouth every 6 (six) hours as needed. pain    Historical Provider, MD  aspirin 81 MG tablet Take 81 mg by mouth daily.    Historical Provider, MD  buPROPion (WELLBUTRIN SR) 100 MG 12 hr tablet Take 100 mg by mouth daily.    Historical Provider, MD  calcium gluconate 500 MG tablet Take 500 mg by mouth 2 (two) times daily.    Historical Provider, MD  diclofenac (FLECTOR) 1.3 % PTCH Place 1 patch onto the skin 2 (two) times daily.    Historical Provider, MD  donepezil (ARICEPT) 10 MG tablet Take 10 mg by mouth at bedtime.    Historical Provider, MD  escitalopram (LEXAPRO) 10 MG tablet Take 10 mg by mouth daily.    Historical Provider, MD  gemfibrozil (LOPID) 600 MG tablet Take 600 mg by mouth 2 (two) times daily before a meal.    Historical Provider, MD  levothyroxine (SYNTHROID, LEVOTHROID) 50 MCG tablet Take 50 mcg by mouth daily.    Historical Provider,  MD  lidocaine (LIDODERM) 5 % Place 1 patch onto the skin daily. Remove & Discard patch within 12 hours or as directed by MD    Historical Provider, MD  multivitamin-lutein (OCUVITE-LUTEIN) CAPS Take 2 capsules by mouth daily.    Historical Provider, MD  nystatin (MYCOSTATIN) powder Apply topically 4 (four) times daily.    Historical Provider, MD  omeprazole (PRILOSEC) 20 MG capsule Take 20 mg by mouth daily.    Historical Provider, MD  potassium chloride SA (K-DUR,KLOR-CON) 20 MEQ tablet Take 20 mEq by mouth 2 (two) times daily.    Historical Provider, MD  Travoprost, BAK Free,  (TRAVATAN) 0.004 % SOLN ophthalmic solution Place 1 drop into both eyes at bedtime.    Historical Provider, MD  triamcinolone cream (KENALOG) 0.1 % Apply 1 application topically 2 (two) times daily.    Historical Provider, MD  triamterene-hydrochlorothiazide (MAXZIDE-25) 37.5-25 MG per tablet Take 1 tablet by mouth daily.    Historical Provider, MD  vitamin B-12 (CYANOCOBALAMIN) 500 MCG tablet Take 500 mcg by mouth daily.    Historical Provider, MD  Vitamin D, Ergocalciferol, (DRISDOL) 50000 UNITS CAPS Take 50,000 Units by mouth every 7 (seven) days. sundays    Historical Provider, MD  zoledronic acid (RECLAST) 5 MG/100ML SOLN injection Inject 5 mg into the vein once.    Historical Provider, MD   BP 135/63 mmHg  Pulse 69  Resp 18  SpO2 99% Physical Exam  Constitutional: She appears well-developed and well-nourished. No distress.  HENT:  Head: Normocephalic.  Oral mucosa dry  Eyes: Conjunctivae are normal. Pupils are equal, round, and reactive to light.  Neck: Neck supple.  Cardiovascular: Normal rate, regular rhythm and normal heart sounds.   Pulmonary/Chest: Effort normal and breath sounds normal. No respiratory distress. She has no wheezes. She has no rales.  Abdominal: Soft. Bowel sounds are normal. She exhibits no distension. There is tenderness. There is no rebound.  Mild suprapubic tenderness  Musculoskeletal: She exhibits no edema.  Tenderness to palpation over right knee, limited range of motion of the right knee due to pain. No tenderness to palpation over her head. Full range of motion of bilateral hips  Neurological: She is alert. No cranial nerve deficit. Coordination normal.  5 out of 5 and equal grip strength bilaterally, moving all extremities.   Skin: Skin is warm and dry.  Psychiatric: She has a normal mood and affect. Her behavior is normal.  Nursing note and vitals reviewed.   ED Course  Procedures (including critical care time) Labs Review Labs Reviewed   COMPREHENSIVE METABOLIC PANEL - Abnormal; Notable for the following:    Sodium 130 (*)    Potassium 2.8 (*)    Chloride 89 (*)    Glucose, Bld 146 (*)    Creatinine, Ser 1.36 (*)    Albumin 2.9 (*)    ALT 13 (*)    Alkaline Phosphatase 179 (*)    GFR calc non Af Amer 32 (*)    GFR calc Af Amer 37 (*)    All other components within normal limits  URINALYSIS, ROUTINE W REFLEX MICROSCOPIC (NOT AT [facility]) - Abnormal; Notable for the following:    APPearance CLOUDY (*)    Nitrite POSITIVE (*)    Leukocytes, UA LARGE (*)    All other components within normal limits  URINE MICROSCOPIC-ADD ON - Abnormal; Notable for the following:    Squamous Epithelial / LPF FEW (*)    Bacteria, UA MANY (*)  All other components within normal limits  URINE CULTURE  CBC WITH DIFFERENTIAL/PLATELET  Rosezena Sensor, ED    Imaging Review Dg Chest 2 View  01/11/2015   CLINICAL DATA:  Weakness and shortness of breath. History of hypertension.  EXAM: CHEST  2 VIEW  COMPARISON:  None  FINDINGS: Cardiac silhouette is normal in size and configuration. The aorta is uncoiled. There is a small hiatal hernia. No mediastinal or hilar masses or convincing adenopathy. No lung consolidation or edema. Mild bronchitic changes noted in the perihilar and medial lung bases. No pleural effusion or pneumothorax. There is a moderate compression fracture at the thoracolumbar junction, likely old. Bones are demineralized.  IMPRESSION: No acute cardiopulmonary disease.   Electronically Signed   By: Amie Portland M.D.   On: 01/11/2015 12:23     EKG Interpretation None      MDM   Final diagnoses:  UTI (lower urinary tract infection)  Orthostatic hypotension   Patient with mild dementia at baseline according to family. Here with syncopal episodes upon sitting up and standing. She has had progressive decline in her activities, not eating or drinking much, feeling stayed just eating popsicles and drinking Coke. She has expressed  to them that she "does not want to live anymore." Patient is DO NOT RESUSCITATE, palliative care only. We'll get labs, urinalysis, will hydrate. Patient appears to be dry.  Patient is severely orthostatic, blood pressure drops from 130 systolic to 70 systolic upon sitting. Did not stand up. IV fluids running. Blood work pending.  Potassium 2.8, given 40 mEq by mouth. Creatinine elevated at 1.36. Patient continues receiving IV fluids. Her urine is positive for nitrites and large ascites with many bacteria, most likely UTI. Culture sent. Rocephin ordered. Patient has normal white blood cell count, her vital signs are normal, doubt sepsis. Constipation further care with family. They do want antibiotics and fluid resuscitation. Will admit.   Spoke with hospitalist and will admit.  Filed Vitals:   01/11/15 1057 01/11/15 1058 01/11/15 1326  BP:  135/63 117/78  Pulse:  69 71  Resp:  18 16  SpO2: 96% 99% 92%     Jaynie Crumble, PA-C 01/12/15 1955  Lyndal Pulley, MD 01/12/15 2309

## 2015-01-11 NOTE — ED Notes (Addendum)
Per EMS pt coming from home with c/o FTT x 3 weeks that is progressively getting worse, Pt had no PO intake x 3 weeks (according to family). Pt is responsive and alert as per her baseline, denies pain. Per EMS pt was orthostatic with SBP 110 laying to 60 palpated when sitting. Pt was given 500 ml NS en route

## 2015-01-11 NOTE — ED Notes (Signed)
Bed: WA17 Expected date:  Expected time:  Means of arrival:  Comments: EMS- Hypotension, weakness

## 2015-01-11 NOTE — H&P (Signed)
[facility] History and Physical  ZERAH HILYER BPZ:[id number] DOB: 27-Mar-1918 DOA: 01/11/2015  Referring physician: Eyvonne Mechanic PA PCP: Jerlyn Ly, MD  Specialists: none yet  80 ? 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-prior use to use a walker-passing out often at home over the past 3 weeks h/o is relayed to me by her daugther who is at the bedside as the patient has some element of dementia At baseline is able to use no assistive device and lives alone and is relatively independent but has a caregiver during the day  Over the past 3 weeks, increasing unsteadiness of gait, on 01/07/15 was not able to use walker and needed to use rolling wheelchair Less by mouth intake, more lethargic, and frequent syncopal episodes  Note that patient is on antihypertensives including diarrhetic's  Orthostatics in the ED showed a significant drop to 78/29  - Chills, - fever, - chest pain, + cough, - blurred vision or double vision -->she has had macular degeneration for 4-5 years and cannot see at all  She is also deaf   daughter relates that patient has not had any dark stool, tarry stool, emesis, rash, exposure to ill contacts No foul odor to urine   89 ? 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-prior use to use a walker-passing out often at home over the past 3 weeks h/o is relayed to me by her daugther who is at the bedside as the patient has some element of dementia At baseline is able to use no assistive device and lives alone and is relatively independent but has a  caregiver during the day  Orthostatics in the ED showed a significant drop to 78/29   3/52 H/o progressive debility  - Chills, - fever, - chest pain, + cough, - blurred vision or double vision -->she has had macular degeneration for 4-5 years and cannot see at all  She is also deaf   daughter relates that patient has not had any dark stool, tarry stool, emesis, rash, exposure to ill contacts No foul odor to urine   Labs on admit Na 130, Cl 89 k 2.8 [repalced with 40 meq in ED po] Alk phos was 140's Hb was wnl No wbc Ua perfomred via i/o cath was concerning for pyelonpehritis Cxr=no acute cardio-pulm disease   Past Medical History  Diagnosis Date  . Pulmonary emboli   . Thyroid disease   . Osteoporosis   . Memory impairment   . Hypertension   . Hypercholesteremia    Past Surgical History  Procedure Laterality Date  . Parathyroidectomy    . Cholecystectomy    . 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 Boniva [Ibandronic Acid] Other (See Comments)    Arm/leg pain  . Lipitor [Atorvastatin] Other (See Comments)    myalgia  . Nsaids     Due to blood thinners, for PE (OFF NOW)  Family History  Problem Relation Age of Onset  . Diabetes Mellitus II Mother     Prior to Admission medications   Medication Sig Start Date End Date Taking? Authorizing Provider  acetaminophen (TYLENOL) 500 MG tablet Take 1,000 mg by mouth every 6 (six) hours as needed. pain   Yes Historical Provider, MD  aspirin 81 MG tablet Take 81 mg by mouth daily.   Yes Historical Provider, MD  buPROPion (WELLBUTRIN SR) 150 MG 12 hr tablet Take 150 mg by mouth daily. 12/06/14  Yes Historical Provider, MD  calcium gluconate 500 MG tablet Take 500 mg by mouth 2 (two)  times daily.   Yes Historical Provider, MD  donepezil (ARICEPT) 10 MG tablet Take 10 mg by mouth 2 (two) times daily.    Yes Historical Provider, MD  escitalopram (LEXAPRO) 10 MG tablet Take 5 mg by mouth at bedtime.    Yes Historical Provider, MD  gemfibrozil (LOPID) 600 MG tablet Take 600 mg by mouth 2 (two) times daily before a meal.   Yes Historical Provider, MD  levothyroxine (SYNTHROID, LEVOTHROID) 50 MCG tablet Take 50 mcg by mouth daily.   Yes Historical Provider, MD  memantine (NAMENDA XR) 28 MG CP24 24 hr capsule Take 28 mg by mouth at bedtime.   Yes Historical Provider, MD  multivitamin-lutein (OCUVITE-LUTEIN) CAPS Take 2 capsules by mouth daily.   Yes Historical Provider, MD  omeprazole (PRILOSEC) 20 MG capsule Take 20 mg by mouth at bedtime.    Yes Historical Provider, MD  potassium chloride SA (K-DUR,KLOR-CON) 20 MEQ tablet Take 20 mEq by mouth daily with breakfast.    Yes Historical Provider, MD  triamcinolone cream (KENALOG) 0.1 % Apply 1 application topically 2 (two) times daily as needed (for rash).    Yes Historical Provider, MD  triamterene-hydrochlorothiazide (MAXZIDE-25) 37.5-25 MG per tablet Take 0.5 tablets by mouth at bedtime.    Yes Historical Provider, MD  vitamin B-12 (CYANOCOBALAMIN) 500 MCG tablet Take 250 mcg by mouth at bedtime.    Yes Historical Provider, MD  Vitamin D, Ergocalciferol, (DRISDOL) 50000 UNITS CAPS Take 50,000 Units by mouth every Sunday.    Yes Historical Provider, MD  zoledronic acid (RECLAST) 5 MG/100ML SOLN injection Inject 5 mg into the vein once.   Yes Historical Provider, MD   Physical Exam: Filed Vitals:   01/11/15 1057 01/11/15 1058 01/11/15 1326  BP:  135/63 117/78  Pulse:  69 71  Resp:  18 16  SpO2: 96% 99% 92%   on exam   pleasant blind extremely hard of hearing Caucasian female   no pallor, no icterus, dry mucosa, poor dentition, no JVD, thick neck, N4-O2 holosystolic grade 4/6 murmur all over precordium  abdomen is soft nontender  nondistended no rebound no guarding   lower extremities show no edema or swelling Neurologically follows commands  Labs on Admission:  Basic Metabolic Panel:  Recent Labs Lab 01/11/15 1150  NA 130*  K 2.8*  CL 89*  CO2 29  GLUCOSE 146*  BUN 19  CREATININE 1.36*  CALCIUM 8.9   Liver Function Tests:  Recent Labs Lab 01/11/15 1150  AST 29  ALT 13*  ALKPHOS 179*  BILITOT 1.0  PROT 7.5  ALBUMIN 2.9*   No results for input(s): LIPASE, AMYLASE in the last 168 hours. No results for input(s): AMMONIA in the last 168 hours. CBC:  Recent Labs Lab 01/11/15 1150  WBC 7.7  NEUTROABS 5.3  HGB 12.5  HCT 36.8  MCV 83.4  PLT 304   Cardiac Enzymes: No results for input(s): CKTOTAL, CKMB, CKMBINDEX, TROPONINI in the last 168 hours.  BNP (last 3 results) No results for input(s): BNP in the last 8760 hours.  ProBNP (last 3 results) No results for input(s): PROBNP in the last 8760 hours.  CBG: No results for input(s): GLUCAP in the last 168 hours.  Radiological Exams on Admission: Dg Chest 2 View  01/11/2015   CLINICAL DATA:  Weakness and shortness of breath. History of hypertension.  EXAM: CHEST  2 VIEW  COMPARISON:  None  FINDINGS: Cardiac silhouette is normal in size and configuration. The aorta is uncoiled. There is a small hiatal hernia. No mediastinal or hilar masses or convincing adenopathy. No lung consolidation or edema. Mild bronchitic changes noted in the perihilar and medial lung bases. No pleural effusion or pneumothorax. There is a moderate compression fracture at the thoracolumbar junction, likely old. Bones are demineralized.  IMPRESSION: No acute cardiopulmonary disease.   Electronically Signed   By: Lajean Manes M.D.   On: 01/11/2015 12:23    EKG: Independently reviewed. Sinus rhythm, PR interval 0.16  QRS axis left anterior fascicular block  No ST-T wave changes    essment/Plan Principal Problem:   Syncope due to orthostatic hypotension -Etiology  secondary to continued use of diuretic HCTZ in setting of poor by mouth intake -Volume replete 1 25 cc an hour for 24 hours -Hold antihypertensives medication  Active Problems:   Orthostatic hypotension Volume depletion   Hypovolemia dehydration -See above discussion -Has hypovolemic hyponatremia -Could replace relatively aggressively as this is not a chronic hyponatremia therefore 125 cc hours appropriate -Repeat labs in a.m. -Consider Foley catheter although family has requested not placement at present time      Lesion of thyroid gland, parathyroidectomy in the 80s? 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 -Reorient frequently   Osteoporosis/osteopenia  -Monitor  -Age-appropriate DEXA screening as when necessary   Polypharmacy in the elderly Some medications on the beers criteria  I have held the following meds on her for the time being and primary care physician to determine best with forward - acetaminophen (TYLENOL) 500 MG tablet  - calcium gluconate 500 MG tablet  - gemfibrozil (LOPID) 600 MG tablet  - multivitamin-lutein (OCUVITE-LUTEIN) CAPS  - omeprazole (PRILOSEC) 20 MG capsule  - potassium chloride SA (K-DUR,KLOR-CON) 20 MEQ tablet  - triamcinolone cream (KENALOG) 0.1 %  - triamterene-hydrochlorothiazide (MAXZIDE-25) 37.5-25 MG per tablet  - vitamin B-12 (CYANOCOBALAMIN) 500 MCG tablet  - Vitamin D, Ergocalciferol, (DRISDOL) 50000 UNITS CAPS  Appt with PCP:  Code Status: DNR confirmed at bedside with family Family Communication: Discussed with daughter in  detail Disposition Plan: SNF versus 24-hour supervision at home-awaiting PT OT evaluation  DVT prophylaxis: SCD Consultants:  Procedures:    Time spent: 20  Verlon Au [facility] Pager [phone]  If 7PM-7AM, please contact night-coverage [email or web address] Password [facility] 01/11/2015, 3:33 PM

## 2015-01-11 NOTE — Progress Notes (Signed)
Received pt from ED being evaluated for syncopal episode due to orthostatic hypotension and UTI. Pt family reports a three week history of declining health. Symptoms included weakness, decreased appetite and eventually not being able to stand up or walk. No report of fever, nausea or vomiting. Pt usually use cane to ambulate at home but progressed to walker and then to wheel chair. Pt received 2L NS in the ED, Rocephin IV and 40 KCL PO for a K of 2.8. Pt is blind and hard of hearing. Pt has two daughters that cares for her. Pt is DNR.

## 2015-01-11 NOTE — ED Notes (Signed)
Dr Samtani at bedside 

## 2015-01-11 NOTE — ED Notes (Signed)
Nurse drawing labs. 

## 2015-01-12 LAB — CBC
HEMATOCRIT: 34.1 % — AB (ref 36.0–46.0)
HEMOGLOBIN: 11.6 g/dL — AB (ref 12.0–15.0)
MCH: 28.9 pg (ref 26.0–34.0)
MCHC: 34 g/dL (ref 30.0–36.0)
MCV: 84.8 fL (ref 78.0–100.0)
PLATELETS: 256 10*3/uL (ref 150–400)
RBC: 4.02 MIL/uL (ref 3.87–5.11)
RDW: 13.3 % (ref 11.5–15.5)
WBC: 6.9 10*3/uL (ref 4.0–10.5)

## 2015-01-12 LAB — COMPREHENSIVE METABOLIC PANEL
ALK PHOS: 147 U/L — AB (ref 38–126)
ALT: 10 U/L — ABNORMAL LOW (ref 14–54)
ANION GAP: 9 (ref 5–15)
AST: 21 U/L (ref 15–41)
Albumin: 2.7 g/dL — ABNORMAL LOW (ref 3.5–5.0)
BILIRUBIN TOTAL: 0.7 mg/dL (ref 0.3–1.2)
BUN: 12 mg/dL (ref 6–20)
CO2: 27 mmol/L (ref 22–32)
Calcium: 7.7 mg/dL — ABNORMAL LOW (ref 8.9–10.3)
Chloride: 99 mmol/L — ABNORMAL LOW (ref 101–111)
Creatinine, Ser: 1 mg/dL (ref 0.44–1.00)
GFR calc non Af Amer: 47 mL/min — ABNORMAL LOW (ref >60)
GFR, EST AFRICAN AMERICAN: 54 mL/min — AB (ref >60)
Glucose, Bld: 96 mg/dL (ref 65–99)
POTASSIUM: 2.8 mmol/L — AB (ref 3.5–5.1)
Sodium: 135 mmol/L (ref 135–145)
Total Protein: 6.4 g/dL — ABNORMAL LOW (ref 6.5–8.1)

## 2015-01-12 LAB — PROTIME-INR
INR: 1.06 (ref 0.00–1.49)
Prothrombin Time: 14 seconds (ref 11.6–15.2)

## 2015-01-12 MED ORDER — POTASSIUM CHLORIDE 20 MEQ/15ML (10%) PO SOLN
40.0000 meq | Freq: Two times a day (BID) | ORAL | Status: DC
  Administered 2015-01-12 (×2): 40 meq via ORAL
  Filled 2015-01-12 (×3): qty 30

## 2015-01-12 MED ORDER — DEXTROSE 5 % IV SOLN
1.0000 g | INTRAVENOUS | Status: DC
  Administered 2015-01-12: 1 g via INTRAVENOUS
  Filled 2015-01-12: qty 10

## 2015-01-12 NOTE — Progress Notes (Signed)
Arlys John XBJ:[id number] DOB: January 17, 1918 DOA: 01/11/2015 PCP: Ezequiel Kayser, MD  Brief narrative: 45 ? 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-prior use to use a walker-passing out often at home over the past 3 weeks Admitted 01/11/15 with possible pyelonephritis as well as significant orthostatic hypotension  Past medical history-As per Problem list Chart reviewed as below-   Consultants:    Procedures:    Antibiotics:  ROcephin   Subjective   Much more oriented Had a sandwich and coffee Daughter bedside No Overnight issues NO fever nor chills   Objective    Interim History:   Telemetry:    Objective: Filed Vitals:   01/11/15 1326 01/11/15 1619 01/11/15 2202 01/12/15 0632  BP: 117/78 133/62 115/42 106/51  Pulse: 71 60 58 62  Temp:  97.8 F (36.6 C) 97.5 F (36.4 C) 97.4 F (36.3 C)  TempSrc:  Oral Oral Oral  Resp: Height:   (1.6 m)    Weight:  70.1 kg (154 lb 8.7 oz)    SpO2: 92%  98% 98%    Intake/Output Summary (Last 24 hours) at 01/12/15 0910 Last data filed at 01/12/15 0600  Gross per 24 hour  Intake   1875 ml  Output      0 ml  Net   1875 ml    Exam:  General: eomi ncat Cardiovascular: s1 s2 + Murmur Respiratory: clear no added sound Abdomen: soft nt nd no rebound Skin no le edema  Data Reviewed: Basic Metabolic Panel:  Recent Labs Lab 01/11/15 1150 01/12/15 0510  NA 130* 135  K 2.8* 2.8*  CL 89* 99*  CO2 29 27  GLUCOSE 146* 96  BUN 19 12  CREATININE 1.36* 1.00  CALCIUM 8.9 7.7*   Liver Function Tests:  Recent Labs Lab 01/11/15 1150 01/12/15 0510  AST 29 21  ALT 13* 10*  ALKPHOS 179* 147*  BILITOT 1.0 0.7  PROT 7.5 6.4*  ALBUMIN 2.9* 2.7*   No results for input(s):  LIPASE, AMYLASE in the last 168 hours. No results for input(s): AMMONIA in the last 168 hours. CBC:  Recent Labs Lab 01/11/15 1150 01/12/15 0510  WBC 7.7 6.9  NEUTROABS 5.3  --   HGB 12.5 11.6*  HCT 36.8 34.1*  MCV 83.4 84.8  PLT 304 256   Cardiac Enzymes: No results for input(s): CKTOTAL, CKMB, CKMBINDEX, TROPONINI in the last 168 hours. BNP: Invalid input(s): POCBNP CBG: No results for input(s): GLUCAP in the last 168 hours.  No results found for this or any previous visit (from the past 240 hour(s)).   Studies:              All Imaging reviewed and is as per above notation   Scheduled Meds: . aspirin  81 mg Oral Daily  . buPROPion  150 mg Oral Daily  . cefTRIAXone (ROCEPHIN)  IV  1 g Intravenous Q24H  . donepezil  10 mg Oral BID  . escitalopram  5 mg Oral QHS  . feeding supplement (ENSURE ENLIVE)  237 mL Oral BID BM  . heparin  5,000 Units Subcutaneous 3 times per day  . levothyroxine  50 mcg Oral QAC breakfast  . memantine  28 mg Oral QHS  . potassium chloride  40 mEq Oral BID  .  sodium chloride  3 mL Intravenous Q12H   Continuous Infusions: . sodium chloride 125 mL/hr at 01/12/15 0204     Assessment/Plan:   Syncope due to orthostatic hypotension -Etiology secondary to continued use of diuretic HCTZ in setting of poor by mouth intake -Volume replete 1 25 cc an hour for 24 hours-->745 cc/hr 8.7.16 -Hold antihypertensives medication   Orthostatic hypotension Volume depletion  Hypovolemia dehydration -See above discussion -Has hypovolemic hyponatremia 2/2 to poor po solute intake -Could replace relatively aggressively as this is not a chronic hyponatremia therefore 125 cc hours appropriate -Resolving from 130--135 on 8.7    Lesion of thyroid gland, parathyroidectomy in the 80s? -Unclear etiology -Continue pamidronate as an outpatient for hypercalcemia -Continue Synthroid -corrected ca in wnl range 8.7   Pulmonary emboli not on AC 2/2 to risk  falls -Remote history of the same at the time of parathyroidectomy [no details in Epic] -Monitor   Dementia in Alzheimer's disease-moderate-on Ache agents  -[OR] bleeding 1.66 with anticholinergic agents  -Has been on these medications for about 2-3 years  -Consider narrowing to Namenda monotherapy ? Per PC  Osteoporosis/osteopenia  -Monitor  -Age-appropriate DEXA screening as when necessary   Polypharmacy in the elderly Some medications on the beers criteria  I have held the following meds on her for the time being and primary care physician to determine best with forward - acetaminophen (TYLENOL) 500 MG tablet  - calcium gluconate 500 MG tablet  - gemfibrozil (LOPID) 600 MG tablet  - multivitamin-lutein (OCUVITE-LUTEIN) CAPS  - omeprazole (PRILOSEC) 20 MG capsule  - potassium chloride SA (K-DUR,KLOR-CON) 20 MEQ tablet  - triamcinolone cream (KENALOG) 0.1 %  - triamterene-hydrochlorothiazide (MAXZIDE-25) 37.5-25 MG per tablet - vitamin B-12 (CYANOCOBALAMIN) 500 MCG tablet    Appt with PCP: Requested Code Status: DNR Family Communication: family +-spoke with daughter who was in room Disposition Plan: home/snf pending PT eval DVT prophylaxis: SCD Consultants:   Pleas Koch, MD  [facility] Pager [phone] 01/12/2015, 9:10 AM    LOS: 1 day

## 2015-01-13 DIAGNOSIS — E43 Unspecified severe protein-calorie malnutrition: Secondary | ICD-10-CM | POA: Insufficient documentation

## 2015-01-13 LAB — CBC
HCT: 32.9 % — ABNORMAL LOW (ref 36.0–46.0)
Hemoglobin: 10.7 g/dL — ABNORMAL LOW (ref 12.0–15.0)
MCH: 27.4 pg (ref 26.0–34.0)
MCHC: 32.5 g/dL (ref 30.0–36.0)
MCV: 84.1 fL (ref 78.0–100.0)
PLATELETS: 248 10*3/uL (ref 150–400)
RBC: 3.91 MIL/uL (ref 3.87–5.11)
RDW: 13.3 % (ref 11.5–15.5)
WBC: 7.7 10*3/uL (ref 4.0–10.5)

## 2015-01-13 LAB — BASIC METABOLIC PANEL
ANION GAP: 8 (ref 5–15)
BUN: 10 mg/dL (ref 6–20)
CO2: 26 mmol/L (ref 22–32)
CREATININE: 0.94 mg/dL (ref 0.44–1.00)
Calcium: 7.4 mg/dL — ABNORMAL LOW (ref 8.9–10.3)
Chloride: 100 mmol/L — ABNORMAL LOW (ref 101–111)
GFR calc Af Amer: 58 mL/min — ABNORMAL LOW (ref >60)
GFR, EST NON AFRICAN AMERICAN: 50 mL/min — AB (ref >60)
GLUCOSE: 97 mg/dL (ref 65–99)
Potassium: 3 mmol/L — ABNORMAL LOW (ref 3.5–5.1)
SODIUM: 134 mmol/L — AB (ref 135–145)

## 2015-01-13 LAB — MAGNESIUM: MAGNESIUM: 1.4 mg/dL — AB (ref 1.7–2.4)

## 2015-01-13 MED ORDER — CEFUROXIME AXETIL 500 MG PO TABS
500.0000 mg | ORAL_TABLET | Freq: Two times a day (BID) | ORAL | Status: DC
  Administered 2015-01-13 – 2015-01-14 (×3): 500 mg via ORAL
  Filled 2015-01-13 (×5): qty 1

## 2015-01-13 MED ORDER — POTASSIUM CHLORIDE 20 MEQ/15ML (10%) PO SOLN: 40.0000 meq | Freq: Two times a day (BID) | ORAL | Status: DC

## 2015-01-13 MED ORDER — POTASSIUM CHLORIDE CRYS ER 20 MEQ PO TBCR
40.0000 meq | EXTENDED_RELEASE_TABLET | Freq: Two times a day (BID) | ORAL | Status: DC
  Administered 2015-01-13 – 2015-01-14 (×3): 40 meq via ORAL
  Filled 2015-01-13 (×3): qty 2

## 2015-01-13 MED ORDER — SODIUM CHLORIDE 0.9 % IV SOLN
INTRAVENOUS | Status: DC
  Administered 2015-01-13 (×2): via INTRAVENOUS

## 2015-01-13 MED ORDER — MIDODRINE HCL 2.5 MG PO TABS
2.5000 mg | ORAL_TABLET | Freq: Three times a day (TID) | ORAL | Status: DC
  Administered 2015-01-13 – 2015-01-14 (×4): 2.5 mg via ORAL
  Filled 2015-01-13 (×6): qty 1

## 2015-01-13 MED ORDER — NITROFURANTOIN MONOHYD MACRO 100 MG PO CAPS: 100.0000 mg | ORAL_CAPSULE | Freq: Two times a day (BID) | ORAL | Status: DC

## 2015-01-13 MED ORDER — CEFUROXIME AXETIL 500 MG PO TABS: 500.0000 mg | ORAL_TABLET | Freq: Two times a day (BID) | ORAL | Status: DC

## 2015-01-13 NOTE — Progress Notes (Signed)
Patient's Orthostatic BP 95/49 HR-72 Lying, 78/39, 76-sitting, unable to obtain standing BP, Dr. Mahala Menghini notified, orders written. Will continue to assess patient.

## 2015-01-13 NOTE — Care Management Important Message (Signed)
Important Message  Patient Details  Name: Gail Schneider MRN: [id number] Date of Birth: 19-Apr-1918   Medicare Important Message Given:  Yes-second notification given    Haskell Flirt 01/13/2015, 1:07 PMImportant Message  Patient Details  Name: Gail Schneider MRN: [id number] Date of Birth: 12/07/1917   Medicare Important Message Given:  Yes-second notification given    Haskell Flirt 01/13/2015, 1:07 PM

## 2015-01-13 NOTE — Progress Notes (Signed)
Initial Nutrition Assessment  DOCUMENTATION CODES:   Severe malnutrition in context of acute illness/injury  INTERVENTION:  - Continue Ensure Enlive BID - Encourage PO intakes - RD will continue to monitor for needs  NUTRITION DIAGNOSIS:   Inadequate oral intake related to poor appetite as evidenced by per patient/family report.  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 BMI indicates overweight status. Pt sleeping at time of visit and all information obtained from personal caregiver. Per chart review, pt has eaten 25% of all meals since admission. Visualized lunch tray with 3-4 bites of sandwich consumed.  PTA pt has had a poor appetite for 3-4 weeks and today's lunch intake was her usual intakes during that time frame. Prior to that she had a very good appetite and made sure to stay well hydrated. During the past 3-4 weeks she has needed much encouragement with food and drink intakes. Her daughter was mixing Boost with ice cream and pt did well with this for a short time but then began to refuse, for the most part.   Caregiver states that she has tried to eat during the same time as pt to offer support and encouragement in a more comfortable environment but pt still will not eat more than a few bites.  It is thought that she lost 15 lbs in the past 3-4 weeks which would indicate 9% body weight in this time frame which is significant.  Not meeting needs. Will  monitor for POC/GOC and needs related to this. Unable to perform physical assessment. Medications reviewed. Labs reviewed; Na: 134 mmol/L, K: 3 mmol/L, Cl: 100 mmol/L, Ca: 7.4 mg/dL, GFR: 50.  Diet Order:  Diet regular Room service appropriate?: Yes; Fluid consistency:: Thin  Skin:  Reviewed, no issues  Last BM:  8/5  Height:   Ht Readings from Last 1 Encounters:  01/11/15  (1.6 m)    Weight:   Wt Readings from Last 1 Encounters:  01/11/15 154 lb 8.7 oz (70.1 kg)    Ideal Body Weight:  52.27 kg (kg)  BMI:  Body mass index is 27.38 kg/(m^2).  Estimated Nutritional Needs:   Kcal:  1400-1600  Protein:  65-75 grams  Fluid:  2 L/day  EDUCATION NEEDS:   No education needs identified at this time     Trenton Gammon, RD, LDN Inpatient Clinical Dietitian Pager # [phone] After hours/weekend pager # [phone]

## 2015-01-13 NOTE — Clinical Social Work Note (Signed)
Clinical Social Work Assessment  Patient Details  Name: Gail Schneider MRN: [id number] Date of Birth: [birth year]  Date of referral:  01/13/15               Reason for consult:  Discharge Planning                Permission sought to share information with:  Family Supports Permission granted to share information::  Yes, Verbal Permission Granted  Name::     Primus Bravo  Agency::     Relationship::  daughter  Contact Information:  [phone]  Housing/Transportation Living arrangements for the past 2 months:  Single Family Home Source of Information:  Adult Children Patient Interpreter Needed:  None Criminal Activity/Legal Involvement Pertinent to Current Situation/Hospitalization:  No - Comment as needed Significant Relationships:  Adult Children Lives with:  Self (pt has 24 hour caregivers) Do you feel safe going back to the place where you live?  Yes Need for family participation in patient care:  Yes (Comment) (pt oriented to person only)  Care giving concerns:  Pt admitted from home with caregivers and pt daughter supportive in pt care. Pt requiring more assistance at this time and PT recommending SNF.    Social Worker assessment / plan:  CSW reviewed chart and noted PT recommendation for New SNF.   CSW visited pt bedside. Pt sleeping soundly at this time. Pt caregiver present at this time. CSW introduced self and explained role. Pt caregiver stated that pt primary decision maker is pt daughter, Juliene Pina and stated that pt daughter, Juliene Pina can be reached via telephone.  CSW contacted pt daughter, Myra via telephone. CSW introduced self and explained role. Pt daughter confirmed that pt caregivers provided pt 24 hour care. CSW discussed with pt daughter that PT evaluated pt and pt requiring additional assistance with ADL's and recommendation for short term rehab at [facility]. Pt daughter stated that pt daughter's would like for pt to remain at home with caregivers. CSW discussed Home  health services and pt daughter is agreeable to home health services, but does not want to place pt in a SNF at this time. CSW discussed with pt daughter that CSW will notify RNCM in order for RNCM to assist with setting up home health services.   CSW notified RNCM, Cookie McGibboney.   No further social work needs identified at this time.   CSW signing off.   Please re-consult if social work needs arise.   Employment status:  Retired Nurse, adult PT Recommendations:  [place] / Referral to community resources:  Other (Comment Required) (Referral to [facility] as pt daughter declined SNF)  Patient/Family's Response to care:  Pt sleeping at time of visit and per chart, oriented to person only. Pt daughters striving to keep pt at home for as long as possible and have caregivers arranged for pt 24 hours a day. Pt daughter agreeable to involving home health services.   Patient/Family's Understanding of and Emotional Response to Diagnosis, Current Treatment, and Prognosis:  Pt daughter eager to know when pt may be medically ready for discharge. Pt daughter feels that pt needs can continue to be met in the home with caregivers, daughters, and home health support.   Emotional Assessment Appearance:  Appears stated age Attitude/Demeanor/Rapport:  Unable to Assess (pt sleeping at time of visit) Affect (typically observed):  Unable to Assess (pt sleeping at this time) Orientation:  Oriented to Self Alcohol / Substance use:  Not Applicable  Psych involvement (Current and /or in the community):  No (Comment)  Discharge Needs  Concerns to be addressed:  Discharge Planning Concerns Readmission within the last 30 days:  No Current discharge risk:  Physical Impairment Barriers to Discharge:  No Barriers Identified   CSW signing off. Please re-consult if social work needs arise.    [place], [place], LCSW 01/13/2015, 3:35 PM [phone]

## 2015-01-13 NOTE — Evaluation (Signed)
Physical Therapy Evaluation Patient Details Name: Gail Schneider MRN: [id number] DOB: August 09, 1917 Today's Date: 01/13/2015   History of Present Illness  81 y.o. female with history of prior PE, thyroid disease, hypertension, mild dementia, blindness, admitted from home with complaint of multiple syncopal episodes, weakness, failure to thrive and found to have orthostatic hypotension.  Clinical Impression  Pt admitted with above diagnosis. Pt currently with functional limitations due to the deficits listed below (see PT Problem List).  Pt will benefit from skilled PT to increase their independence and safety with mobility to allow discharge to the venue listed below.  Pt up in recliner on arrival requesting return to supine.  Pt requiring increased assist for transfers and reporting dizziness.  No family present however caregiver reports pt requiring more assist at home lately and uncertain if other caregiver can provide current assist level.  Pt may need ST-SNF prior to return  Home.     Follow Up Recommendations SNF    Equipment Recommendations  None recommended by PT    Recommendations for Other Services       Precautions / Restrictions Precautions Precautions: Fall Precaution Comments: orthostatic hypotension      Mobility  Bed Mobility Overal bed mobility: Needs Assistance Bed Mobility: Sit to Supine       Sit to supine: +2 for physical assistance;Mod assist   General bed mobility comments: assist for LEs onto bed  Transfers Overall transfer level: Needs assistance Equipment used: Rolling walker (2 wheeled) Transfers: Sit to/from [employer] Sit to Stand: Mod assist Stand pivot transfers: Mod assist       General transfer comment: placed pt's hands on RW due to visual impairment, assist to rise and steady, difficulty pivoting, pt reporting increased dizziness as well, assisted out of recliner and back to bed  Ambulation/Gait                 Stairs            Wheelchair Mobility    Modified Rankin (Stroke Patients Only)       Balance                                             Pertinent Vitals/Pain Pain Assessment: No/denies pain    Home Living Family/patient expects to be discharged to:: Private residence   Available Help at Discharge: Personal care attendant;Family;Available 24 hours/day Type of Home: House         Home Equipment: Walker - 2 wheels;Cane - single point;Wheelchair - manual Additional Comments: per chart review    Prior Function Level of Independence: Needs assistance         Comments: caregiver in room reports pt typically supervision level, however recently requiring more assist      Hand Dominance        Extremity/Trunk Assessment   Upper Extremity Assessment: Generalized weakness           Lower Extremity Assessment: Generalized weakness         Communication   Communication: HOH;Other (comment) (hx of blindness)  Cognition Arousal/Alertness: Awake/alert Behavior During Therapy: Flat affect Overall Cognitive Status: Difficult to assess                      General Comments      Exercises        Assessment/Plan  PT Assessment Patient needs continued PT services  PT Diagnosis Difficulty walking;Generalized weakness   PT Problem List Decreased strength;Decreased activity tolerance;Decreased balance;Decreased mobility;Decreased knowledge of use of DME  PT Treatment Interventions DME instruction;Gait training;Functional mobility training;Patient/family education;Therapeutic activities;Therapeutic exercise;Balance training   PT Goals (Current goals can be found in the Care Plan section) Acute Rehab PT Goals PT Goal Formulation: With patient Time For Goal Achievement: 01/27/15 Potential to Achieve Goals: Good    Frequency Min 3X/week   Barriers to discharge        Co-evaluation               End of Session  Equipment Utilized During Treatment: Gait belt Activity Tolerance: Other (comment);Patient limited by fatigue (limited by dizziness) Patient left: in bed;with call bell/phone within reach;with bed alarm set Nurse Communication: Mobility status         Time: 1001-1010 PT Time Calculation (min) (ACUTE ONLY): 9 min   Charges:   PT Evaluation $Initial PT Evaluation Tier I: 1 Procedure     PT G Codes:        Aryelle Figg,KATHrine E 01/13/2015, 11:49 AM Zenovia Jarred, PT, DPT 01/13/2015 Pager: [phone]

## 2015-01-13 NOTE — Progress Notes (Signed)
Gail Schneider:[id number] DOB: [birth year] DOA: 01/11/2015 PCP: Ezequiel Kayser, MD  Brief narrative:  81 ? 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-prior use to use a walker-passing out often at home over the past 3 weeks Admitted 01/11/15 with possible pyelonephritis as well as significant orthostatic hypotension  Past medical history-As per Problem list Chart reviewed as below-   Consultants:    Procedures:    Antibiotics:  Rocephin   Subjective   Well  Some confusion overnight and apparently thinks that she is in a facility/motel She is hallucinating and seeing peopkle that are not there acc to daughter No n/v today tolerating some diet Mild abd discomfiort     Objective    Interim History:   Telemetry:    Objective: Filed Vitals:   01/12/15 0632 01/12/15 1340 01/12/15 2136 01/13/15 0528  BP: 106/51 102/42 126/99 124/50  Pulse: 62 70 70 71  Temp: 97.4 F (36.3 C) 97.8 F (36.6 C) 98.5 F (36.9 C) 98.1 F (36.7 C)  TempSrc: Oral Oral Oral Oral  Resp: 20 18 20 20   Height:      Weight:      SpO2: 98% 100% 99% 98%    Intake/Output Summary (Last 24 hours) at 01/13/15 0900 Last data filed at 01/13/15 0845  Gross per 24 hour  Intake 3016.25 ml  Output      0 ml  Net 3016.25 ml    Exam:  General: eomi ncat Cardiovascular: s1 s2 + Murmur Respiratory: clear no added sound Abdomen: sof tslightly tender lower quadrant Skin no le edema  Data Reviewed: Basic Metabolic Panel:  Recent Labs Lab 01/11/15 1150 01/12/15 0510 01/13/15 0425  NA 130* 135 134*  K 2.8* 2.8* 3.0*  CL 89* 99* 100*  CO2 29 27 26   GLUCOSE 146* 96 97  BUN 19 12 10   CREATININE 1.36* 1.00 0.94  CALCIUM 8.9 7.7* 7.4*   Liver Function Tests:  Recent  Labs Lab 01/11/15 1150 01/12/15 0510  AST 29 21  ALT 13* 10*  ALKPHOS 179* 147*  BILITOT 1.0 0.7  PROT 7.5 6.4*  ALBUMIN 2.9* 2.7*   No results for input(s): LIPASE, AMYLASE in the last 168 hours. No results for input(s): AMMONIA in the last 168 hours. CBC:  Recent Labs Lab 01/11/15 1150 01/12/15 0510 01/13/15 0425  WBC 7.7 6.9 7.7  NEUTROABS 5.3  --   --   HGB 12.5 11.6* 10.7*  HCT 36.8 34.1* 32.9*  MCV 83.4 84.8 84.1  PLT 304 256 248   Cardiac Enzymes: No results for input(s): CKTOTAL, CKMB, CKMBINDEX, TROPONINI in the last 168 hours. BNP: Invalid input(s): POCBNP CBG: No results for input(s): GLUCAP in the last 168 hours.  Recent Results (from the past 240 hour(s))  Urine culture     Status: None (Preliminary result)   Collection Time: 01/11/15 12:30 PM  Result Value Ref Range Status   Specimen Description URINE, CATHETERIZED  Final   Special Requests NONE  Final   Culture   Final    >=100,000 COLONIES/mL GRAM NEGATIVE RODS CULTURE REINCUBATED FOR BETTER GROWTH Performed at [facility]    Report Status PENDING  Incomplete     Studies:              All Imaging reviewed and is as  per above notation   Scheduled Meds: . aspirin  81 mg Oral Daily  . buPROPion  150 mg Oral Daily  . cefUROXime  500 mg Oral BID WC  . donepezil  10 mg Oral BID  . escitalopram  5 mg Oral QHS  . feeding supplement (ENSURE ENLIVE)  237 mL Oral BID BM  . heparin  5,000 Units Subcutaneous 3 times per day  . levothyroxine  50 mcg Oral QAC breakfast  . memantine  28 mg Oral QHS  . potassium chloride  40 mEq Oral BID  . sodium chloride  3 mL Intravenous Q12H   Continuous Infusions:     Assessment/Plan:   Syncope due to orthostatic hypotension -Etiology secondary to continued use of diuretic HCTZ in setting of poor by mouth intake -Volume replete 1 25 cc an hour for 24 hours-->75 cc/hr 8.7.16-->NS locked off 01/12/17 -rpt orthostatics today -HCTZ should be d/c off of  her med-list from here on  E. Coli pyelonephritis -Cultures pending from admit -Narrow ceftriaxone after 48 hrs to PO Ceftin 500 bid -Await final c/s   Orthostatic hypotension Volume depletion  Hypovolemic hyponatremia c dehydration -See above discussion -Has hypovolemic hyponatremia 2/2 to poor po solute intake -Fluid/sodium replaced aggressively-Saline lock IV -Resolving from 130--135 on 8.7    Lesion of thyroid gland, parathyroidectomy in the 80s? 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 Per PCP  Osteoporosis/osteopenia  -Monitor  -Age-appropriate DEXA screening as when necessary   Polypharmacy in the elderly Some medications on the beers criteria  I have held the following meds on her for the time being and primary care physician to determine best with forward - acetaminophen (TYLENOL) 500 MG tablet  - calcium gluconate 500 MG tablet  - gemfibrozil (LOPID) 600 MG tablet  - multivitamin-lutein (OCUVITE-LUTEIN) CAPS  - omeprazole (PRILOSEC) 20 MG capsule  - potassium chloride SA (K-DUR,KLOR-CON) 20 MEQ tablet  - triamcinolone cream (KENALOG) 0.1 %  - triamterene-hydrochlorothiazide (MAXZIDE-25) 37.5-25 MG per tablet - vitamin B-12 (CYANOCOBALAMIN) 500 MCG tablet    Code Status: DNR Family Communication: -spoke with daughter who was in room Disposition Plan:snf pending PT eval DVT prophylaxis: SCD Consultants:   Pleas Koch, MD  [facility] Pager [phone] 01/13/2015, 9:00  AM    LOS: 2 days

## 2015-01-13 NOTE — Progress Notes (Signed)
Re-assessed orthostatic vital signs. MD made aware. No new orders at this time. Continue to monitor, follow up with PT.

## 2015-01-14 LAB — URINE CULTURE: Culture: >=100000

## 2015-01-14 LAB — BASIC METABOLIC PANEL
Anion gap: 6 (ref 5–15)
BUN: 10 mg/dL (ref 6–20)
CALCIUM: 7 mg/dL — AB (ref 8.9–10.3)
CO2: 26 mmol/L (ref 22–32)
Chloride: 104 mmol/L (ref 101–111)
Creatinine, Ser: 0.79 mg/dL (ref 0.44–1.00)
GFR calc Af Amer: >60 mL/min (ref >60)
Glucose, Bld: 133 mg/dL — ABNORMAL HIGH (ref 65–99)
POTASSIUM: 3.7 mmol/L (ref 3.5–5.1)
Sodium: 136 mmol/L (ref 135–145)

## 2015-01-14 LAB — MAGNESIUM: Magnesium: 1.3 mg/dL — ABNORMAL LOW (ref 1.7–2.4)

## 2015-01-14 MED ORDER — POTASSIUM CHLORIDE 20 MEQ/15ML (10%) PO SOLN: 40.0000 meq | Freq: Two times a day (BID) | ORAL | Status: AC

## 2015-01-14 MED ORDER — MIDODRINE HCL 2.5 MG PO TABS: 2.5000 mg | ORAL_TABLET | Freq: Three times a day (TID) | ORAL | Status: DC

## 2015-01-14 MED ORDER — POTASSIUM CHLORIDE CRYS ER 20 MEQ PO TBCR: 40.0000 meq | EXTENDED_RELEASE_TABLET | Freq: Two times a day (BID) | ORAL | Status: DC

## 2015-01-14 MED ORDER — CEFUROXIME AXETIL 500 MG PO TABS: 500.0000 mg | ORAL_TABLET | Freq: Two times a day (BID) | ORAL | Status: DC

## 2015-01-14 NOTE — Progress Notes (Signed)
Spoke with pt's daughter Gail Schneider at bedside concerning HH needs. Gail Schneider states that a private duty company has already been arranged. Instructed her to call PCP after discharge for any Home Needs or questions.

## 2015-01-14 NOTE — Discharge Summary (Addendum)
Physician Discharge Summary  Gail Schneider HKV:[id number] DOB: 29-Oct-1917 DOA: 01/11/2015  PCP: Ezequiel Kayser, MD  Admit date: 01/11/2015 Discharge date: 01/14/2015  Time spent: 35 minutes  Recommendations for Outpatient Follow-up:  1. Medication changes on d/c  Order on Discharge  - cefUROXime (CEFTIN) 500 MG tablet --STop date 01/17/15 - midodrine (PROAMATINE) 2.5 MG tablet  - potassium chloride SA (K-DUR,KLOR-CON) 20 MEQ tablet 2. ortho stasis needs to be managed cautiously--consider increasing dose Midodrine in OP setting 3. -Recommend knee high TED hose 4. Consider simplifying Polypharmacy--some evidence to suggest Aricept long-term ineffective for Dementia, but Namenda reasonable long term 5. Would recommend intravascular fluid repletion of volume, about 2 liters of oral fluids a day 6. Complete Keflex course and frequent toileting as OP 7. Needs Electrolytes checked as OP.  Consider CBC as well 8. Will ask for [facility] to see patient  Discharge Diagnoses:  Principal Problem:   Syncope due to orthostatic hypotension Active Problems:   Orthostatic hypotension   Volume depletion   Hypovolemia dehydration   Lesion of thyroid gland   Pulmonary emboli not on AC 2/2 to risk falls   Dementia in Alzheimer's disease-moderate-on Ache agents    Hypertension goal BP (blood pressure) < 140/90   Protein-calorie malnutrition, severe   Discharge Condition: dnr  Diet recommendation: regular  Filed Weights   01/11/15 1619  Weight: 70.1 kg (154 lb 8.7 oz)    History of present illness:  63 ? 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-prior use to use a walker -passing out often at home over the past 3 weeks Admitted 01/11/15 with possible pyelonephritis as well as  significant orthostatic hypotension  Hospital Course:    Syncope due to orthostatic hypotension -Etiology secondary to continued use of diuretic HCTZ in setting of poor by mouth intake -Volume replete 1 25 cc an hour for 24 hours-->75 cc/hr 8.7.16-->NS locked off 01/12/17 -rpt orthostatics today -HCTZ should be d/c off of her med-list from here on  Enterococcus/Proteus pyelonephritis  -Narrow ceftriaxone after 48 hrs to PO Ceftin 500 bid -lab indicated sensitivities were good for use of Ceftin   Orthostatic hypotension Volume depletion  Hypovolemic hyponatremia c dehydration -See above discussion -Has hypovolemic hyponatremia 2/2 to poor po solute intake -Fluid/sodium replaced aggressively-Saline lock IV -Resolving  on 8.9 Gatorade as OP for solute    Lesion of thyroid gland, parathyroidectomy in the 80s? 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 Per PCP  Osteoporosis/osteopenia  -Monitor  -Age-appropriate DEXA screening as when necessary   Polypharmacy in the elderly Some medications on the beers criteria  I  held the following meds in-patient and primary care physician to determine best with forward - acetaminophen (TYLENOL) 500 MG tablet  - calcium gluconate 500 MG tablet  - gemfibrozil (LOPID) 600 MG tablet  - multivitamin-lutein (OCUVITE-LUTEIN) CAPS  -  omeprazole (PRILOSEC) 20 MG capsule  - potassium chloride SA (K-DUR,KLOR-CON) 20  MEQ tablet  - triamcinolone cream (KENALOG) 0.1 %  - triamterene-hydrochlorothiazide (MAXZIDE-25) 37.5-25 MG per tablet - vitamin B-12 (CYANOCOBALAMIN) 500 MCG tablet   Discharge Exam: Filed Vitals:   01/14/15 0614  BP: 131/71  Pulse: 68  Temp: 98.1 F (36.7 C)  Resp: 18    General: eomi ncat Cardiovascular: s1 s2 no m/r/g Respiratory: clear Much more oriented and alert tol 60% meals No cp   Discharge Instructions   Discharge Instructions    Diet - low sodium heart healthy    Complete by:  As directed      Increase activity slowly    Complete by:  As directed           Current Discharge Medication List    START taking these medications   Details  cefUROXime (CEFTIN) 500 MG tablet Take 1 tablet (500 mg total) by mouth 2 (two) times daily with a meal. Qty: 8 tablet, Refills: 0    midodrine (PROAMATINE) 2.5 MG tablet Take 1 tablet (2.5 mg total) by mouth 3 (three) times daily with meals. Qty: 90 tablet, Refills: 0    potassium chloride 20 MEQ/15ML (10%) SOLN Take 30 mLs (40 mEq total) by mouth 2 (two) times daily. Qty: 450 mL, Refills: 0      CONTINUE these medications which have NOT CHANGED   Details  acetaminophen (TYLENOL) 500 MG tablet Take 1,000 mg by mouth every 6 (six) hours as needed. pain    aspirin 81 MG tablet Take 81 mg by mouth daily.    buPROPion (WELLBUTRIN SR) 150 MG 12 hr tablet Take 150 mg by mouth daily. Refills: 0    calcium gluconate 500 MG tablet Take 500 mg by mouth 2 (two) times daily.    donepezil (ARICEPT) 10 MG tablet Take 10 mg by mouth 2 (two) times daily.     escitalopram (LEXAPRO) 10 MG tablet Take 5 mg by mouth at bedtime.     gemfibrozil (LOPID) 600 MG tablet Take 600 mg by mouth 2 (two) times daily before a meal.    levothyroxine (SYNTHROID, LEVOTHROID) 50 MCG tablet Take 50 mcg by mouth daily.    memantine (NAMENDA XR) 28 MG CP24 24 hr capsule Take 28 mg by mouth at bedtime.    multivitamin-lutein  (OCUVITE-LUTEIN) CAPS Take 2 capsules by mouth daily.    omeprazole (PRILOSEC) 20 MG capsule Take 20 mg by mouth at bedtime.     triamcinolone cream (KENALOG) 0.1 % Apply 1 application topically 2 (two) times daily as needed (for rash).     vitamin B-12 (CYANOCOBALAMIN) 500 MCG tablet Take 250 mcg by mouth at bedtime.     Vitamin D, Ergocalciferol, (DRISDOL) 50000 UNITS CAPS Take 50,000 Units by mouth every Sunday.     zoledronic acid (RECLAST) 5 MG/100ML SOLN injection Inject 5 mg into the vein See admin instructions. Yearly      STOP taking these medications     potassium chloride SA (K-DUR,KLOR-CON) 20 MEQ tablet      triamterene-hydrochlorothiazide (MAXZIDE-25) 37.5-25 MG per tablet        Allergies  Allergen Reactions  . Boniva [Ibandronic Acid] Other (See Comments)    Arm/leg pain  . Lipitor [Atorvastatin] Other (See Comments)    myalgia  . Nsaids     Due to blood thinners, for PE (OFF NOW)      The results of significant diagnostics from this hospitalization (including imaging,  microbiology, ancillary and laboratory) are listed below for reference.    Significant Diagnostic Studies: Dg Chest 2 View  01/11/2015   CLINICAL DATA:  Weakness and shortness of breath. History of hypertension.  EXAM: CHEST  2 VIEW  COMPARISON:  None  FINDINGS: Cardiac silhouette is normal in size and configuration. The aorta is uncoiled. There is a small hiatal hernia. No mediastinal or hilar masses or convincing adenopathy. No lung consolidation or edema. Mild bronchitic changes noted in the perihilar and medial lung bases. No pleural effusion or pneumothorax. There is a moderate compression fracture at the thoracolumbar junction, likely old. Bones are demineralized.  IMPRESSION: No acute cardiopulmonary disease.   Electronically Signed   By: Amie Portland M.D.   On: 01/11/2015 12:23    Microbiology: Recent Results (from the past 240 hour(s))  Urine culture     Status: None (Preliminary  result)   Collection Time: 01/11/15 12:30 PM  Result Value Ref Range Status   Specimen Description URINE, CATHETERIZED  Final   Special Requests NONE  Final   Culture   Final    >=100,000 COLONIES/mL GRAM NEGATIVE RODS 20,000 COLONIES/mL ENTEROCOCCUS SPECIES Performed at [facility]    Report Status PENDING  Incomplete     Labs: Basic Metabolic Panel:  Recent Labs Lab 01/11/15 1150 01/12/15 0510 01/13/15 0425 01/14/15 0423  NA 130* 135 134* 136  K 2.8* 2.8* 3.0* 3.7  CL 89* 99* 100* 104  CO2 29 27 26 26   GLUCOSE 146* 96 97 133*  BUN 19 12 10 10   CREATININE 1.36* 1.00 0.94 0.79  CALCIUM 8.9 7.7* 7.4* 7.0*  MG  --   --  1.4* 1.3*   Liver Function Tests:  Recent Labs Lab 01/11/15 1150 01/12/15 0510  AST 29 21  ALT 13* 10*  ALKPHOS 179* 147*  BILITOT 1.0 0.7  PROT 7.5 6.4*  ALBUMIN 2.9* 2.7*   No results for input(s): LIPASE, AMYLASE in the last 168 hours. No results for input(s): AMMONIA in the last 168 hours. CBC:  Recent Labs Lab 01/11/15 1150 01/12/15 0510 01/13/15 0425  WBC 7.7 6.9 7.7  NEUTROABS 5.3  --   --   HGB 12.5 11.6* 10.7*  HCT 36.8 34.1* 32.9*  MCV 83.4 84.8 84.1  PLT 304 256 248   Cardiac Enzymes: No results for input(s): CKTOTAL, CKMB, CKMBINDEX, TROPONINI in the last 168 hours. BNP: BNP (last 3 results) No results for input(s): BNP in the last 8760 hours.  ProBNP (last 3 results) No results for input(s): PROBNP in the last 8760 hours.  CBG: No results for input(s): GLUCAP in the last 168 hours.     SignedRhetta Mura  [facility] 01/14/2015, 8:46 AM      Order on Discharge  - cefUROXime (CEFTIN) 500 MG tablet - midodrine (PROAMATINE) 2.5 MG tablet - potassium chloride SA (K-DUR,KLOR-CON) 20 MEQ tablet

## 2015-04-16 ENCOUNTER — Telehealth: Payer: Self-pay | Admitting: *Deleted

## 2015-04-16 NOTE — Telephone Encounter (Signed)
Pt's dtr, Myra states pt has a thick toenail with redness around and has to [facility] she can set up an appt, but what to do til then.  I told Myra to soak in Epsom salt warm water, and cover with an antibiotic ointment bandaid until seen in office, but if redness worsens, swelling or drainage go to the ER or primary doctor.

## 2015-04-21 ENCOUNTER — Encounter: Payer: Self-pay | Admitting: Podiatry

## 2015-04-21 ENCOUNTER — Ambulatory Visit (INDEPENDENT_AMBULATORY_CARE_PROVIDER_SITE_OTHER): Payer: Medicare PPO | Admitting: Podiatry

## 2015-04-21 VITALS — BP 70/40 | HR 82 | Resp 16

## 2015-04-21 DIAGNOSIS — M79673 Pain in unspecified foot: Secondary | ICD-10-CM

## 2015-04-21 DIAGNOSIS — L600 Ingrowing nail: Secondary | ICD-10-CM | POA: Diagnosis not present

## 2015-04-21 NOTE — Patient Instructions (Addendum)

## 2015-04-22 ENCOUNTER — Telehealth: Payer: Self-pay | Admitting: *Deleted

## 2015-04-22 NOTE — Telephone Encounter (Signed)
Called patient at [phone] (Home #) to check to see how they were doing from their ingrown toenail procedure that was performed on Monday, April 21, 2015. Pt stated, "toe feeling okay".

## 2015-04-23 NOTE — Progress Notes (Signed)
Subjective:     Patient ID: Gail Schneider, female   DOB: 1917/10/08, 81 y.o.   MRN: [id number]  HPI patient states I've a painful left hallux nail on the border and it can be hard for me at times to wear shoe gear comfortably   Review of Systems  All other systems reviewed and are negative.      Objective:   Physical Exam  Constitutional: She is oriented to person, place, and time.  Cardiovascular: Intact distal pulses.   Musculoskeletal: Normal range of motion.  Neurological: She is oriented to person, place, and time.  Skin: Skin is warm and dry.  Nursing note and vitals reviewed.  neurovascular status was found to be diminished but intact with muscle strength reduced but normal for her age with patient having a thickened hallux nail left with incurvation of the border that makes it hard to wear shoe gear comfortably     Assessment:     Ingrown toenail deformity left hallux    Plan:     Reviewed this with patient and caregiver and recommended removal of the nail corner and allowing it to drain with the possibility for a permanent procedure but does not get better but we like to avoid this due to advanced age. I infiltrated the left hallux 60 mg I can Marcaine mixture remove the borders and cleaned out the corners and applied sterile dressing. Reappoint to recheck

## 2015-11-20 ENCOUNTER — Ambulatory Visit (INDEPENDENT_AMBULATORY_CARE_PROVIDER_SITE_OTHER): Payer: Medicare Other | Admitting: Podiatry

## 2015-11-20 ENCOUNTER — Encounter: Payer: Self-pay | Admitting: Podiatry

## 2015-11-20 VITALS — BP 146/87 | HR 75 | Resp 14

## 2015-11-20 DIAGNOSIS — L03032 Cellulitis of left toe: Secondary | ICD-10-CM

## 2015-11-20 DIAGNOSIS — L600 Ingrowing nail: Secondary | ICD-10-CM | POA: Diagnosis not present

## 2015-11-20 MED ORDER — CEPHALEXIN 500 MG PO CAPS: 500.0000 mg | ORAL_CAPSULE | Freq: Two times a day (BID) | ORAL | Status: DC

## 2015-11-20 NOTE — Progress Notes (Signed)
Subjective:     Patient ID: Gail Schneider, female   DOB: [birth year], 82 y.o.   MRN: [id number]  HPI this patient presents to the office with chief complaint of pain noted on the inside big toe left foot. She states that it a red, swollen and painful and hurts when she walks and wears her shoes. She was seen last November by Dr. Charlsie Merlesregal for a similar problem required anesthesia to her left big toe and the removal of the offending portion of nail. She healed. Following that, but the problem appears to have returned. She presents the office today for evaluation and treatment of this condition   Review of Systems     Objective:   Physical Exam GENERAL APPEARANCE: Alert, conversant. Appropriately groomed. No acute distress.  VASCULAR: Pedal pulses are  palpable at  [facility].  Capillary refill time is immediate to all digits,  Normal temperature gradient.  Digital hair growth is present bilateral  NEUROLOGIC: sensation is normal to 5.07 monofilament at 5/5 sites bilateral.  Light touch is intact bilateral, Muscle strength normal.  MUSCULOSKELETAL: acceptable muscle strength, tone and stability bilateral.  Intrinsic muscluature intact bilateral.  Rectus appearance of foot and digits noted bilateral.   DERMATOLOGIC: skin color, texture, and turgor are within normal limits.  No preulcerative lesions or ulcers  are seen, no interdigital maceration noted.  No open lesions present.  Digital nails are asymptomatic. No drainage noted.      Assessment:     Paronychia medial border left great toe  Ingrown nail medial border left great toe.     Plan:    ROV  Treatment options and alternatives discussed.  Recommended an incision and drainage and patient agreed.  Left hallux was prepped with alcohol and a 3cc. of  2% lidocaine plain was administered in a digital block fashion.  The toe was then prepped with betadine solution .  The offending nail border was then excised and all necrotic tissue was  resected.  The area was then cleansed  and antibiotic ointment and a dry sterile dressing was applied.  The patient was dispensed instructions for aftercare. Prescribed cephalexin #15.  Home soaks were given.  RTC 10 weeks for nail care.  Call if toe continues to be painful   Helane GuntherGregory Yavier Snider DPM

## 2015-12-12 ENCOUNTER — Other Ambulatory Visit (HOSPITAL_COMMUNITY): Payer: Self-pay | Admitting: *Deleted

## 2015-12-15 ENCOUNTER — Ambulatory Visit (HOSPITAL_COMMUNITY): Payer: Medicare PPO | Attending: Internal Medicine

## 2015-12-25 ENCOUNTER — Encounter: Payer: Self-pay | Admitting: Podiatry

## 2015-12-25 ENCOUNTER — Ambulatory Visit (INDEPENDENT_AMBULATORY_CARE_PROVIDER_SITE_OTHER): Payer: Medicare Other | Admitting: Podiatry

## 2015-12-25 VITALS — BP 128/76 | HR 82 | Resp 14

## 2015-12-25 DIAGNOSIS — L97521 Non-pressure chronic ulcer of other part of left foot limited to breakdown of skin: Secondary | ICD-10-CM | POA: Diagnosis not present

## 2015-12-25 MED ORDER — CEPHALEXIN 500 MG PO CAPS: 500.0000 mg | ORAL_CAPSULE | Freq: Two times a day (BID) | ORAL | Status: DC

## 2015-12-25 NOTE — Progress Notes (Signed)
Subjective:     Patient ID: Gail Schneider, female   DOB: Jul 29, 1917, [age] y.o.   MRN: [id number]  HPI this patient presents the office concerned about drainage coming from the big toe of the left foot. Patient previously had local anesthesia in nail removal performed by myself one month ago. She now has developed an open draining area on the big toe of the left foot. Patient states draining occurs and they have soaked and bandaged the site, but the drainage. She and her daughter are concerned about this draining lesion and presents the office for an evaluation and treatment   Review of Systems     Objective:   Physical Exam neurovascular status is same as in June. She has a red, inflamed ulcerated area on the dorsal aspect of the left foot. No swelling or streaking noted from the site. This ulcer is over the IPJ between the surgical site and the anesthesia area     Assessment:     Ulcer left hallux    Plan:     ROV  Prescribe cephalexin for local wound left hallux.  Neosporin/DSD.  Continue home soaks.  RTC prn   Helane GuntherGregory Raja Liska DPM

## 2015-12-25 NOTE — Addendum Note (Signed)
Addended by: Harlon FlorSOUTHERLAND, Tonique Mendonca L on: 12/25/2015 10:02 AM   Modules accepted: Orders

## 2016-01-29 ENCOUNTER — Ambulatory Visit: Payer: Medicare Other | Admitting: Podiatry

## 2016-06-14 ENCOUNTER — Emergency Department (HOSPITAL_COMMUNITY)
Admission: EM | Admit: 2016-06-14 | Discharge: 2016-06-14 | Disposition: A | Discharge status: Home / Self Care | Payer: Medicare Other | Attending: Emergency Medicine | Admitting: Emergency Medicine

## 2016-06-14 ENCOUNTER — Encounter (HOSPITAL_COMMUNITY): Payer: Self-pay

## 2016-06-14 ENCOUNTER — Emergency Department (HOSPITAL_COMMUNITY): Payer: Medicare Other

## 2016-06-14 DIAGNOSIS — R443 Hallucinations, unspecified: Secondary | ICD-10-CM | POA: Insufficient documentation

## 2016-06-14 DIAGNOSIS — R39198 Other difficulties with micturition: Secondary | ICD-10-CM | POA: Diagnosis present

## 2016-06-14 DIAGNOSIS — N390 Urinary tract infection, site not specified: Secondary | ICD-10-CM

## 2016-06-14 DIAGNOSIS — I10 Essential (primary) hypertension: Secondary | ICD-10-CM | POA: Diagnosis not present

## 2016-06-14 DIAGNOSIS — Z7982 Long term (current) use of aspirin: Secondary | ICD-10-CM | POA: Diagnosis not present

## 2016-06-14 DIAGNOSIS — Z79899 Other long term (current) drug therapy: Secondary | ICD-10-CM | POA: Insufficient documentation

## 2016-06-14 DIAGNOSIS — R410 Disorientation, unspecified: Secondary | ICD-10-CM | POA: Insufficient documentation

## 2016-06-14 LAB — CBC WITH DIFFERENTIAL/PLATELET
BASOS ABS: 0 10*3/uL (ref 0.0–0.1)
Basophils Relative: 0 %
Eosinophils Absolute: 0.1 10*3/uL (ref 0.0–0.7)
Eosinophils Relative: 1 %
HEMATOCRIT: 31.7 % — AB (ref 36.0–46.0)
Hemoglobin: 10.7 g/dL — ABNORMAL LOW (ref 12.0–15.0)
Lymphocytes Relative: 31 %
Lymphs Abs: 2.2 10*3/uL (ref 0.7–4.0)
MCH: 28.4 pg (ref 26.0–34.0)
MCHC: 33.8 g/dL (ref 30.0–36.0)
MCV: 84.1 fL (ref 78.0–100.0)
MONO ABS: 1 10*3/uL (ref 0.1–1.0)
Monocytes Relative: 14 %
Neutro Abs: 3.8 10*3/uL (ref 1.7–7.7)
Neutrophils Relative %: 54 %
Platelets: 192 10*3/uL (ref 150–400)
RBC: 3.77 MIL/uL — ABNORMAL LOW (ref 3.87–5.11)
RDW: 13.2 % (ref 11.5–15.5)
WBC: 7.2 10*3/uL (ref 4.0–10.5)

## 2016-06-14 LAB — COMPREHENSIVE METABOLIC PANEL
ALBUMIN: 2.6 g/dL — AB (ref 3.5–5.0)
ALT: 12 U/L — ABNORMAL LOW (ref 14–54)
ANION GAP: 9 (ref 5–15)
AST: 26 U/L (ref 15–41)
Alkaline Phosphatase: 68 U/L (ref 38–126)
BILIRUBIN TOTAL: 0.5 mg/dL (ref 0.3–1.2)
BUN: 12 mg/dL (ref 6–20)
CALCIUM: 8.3 mg/dL — AB (ref 8.9–10.3)
CO2: 28 mmol/L (ref 22–32)
Chloride: 96 mmol/L — ABNORMAL LOW (ref 101–111)
Creatinine, Ser: 0.77 mg/dL (ref 0.44–1.00)
GFR calc Af Amer: >60 mL/min (ref >60)
GFR calc non Af Amer: >60 mL/min (ref >60)
GLUCOSE: 115 mg/dL — AB (ref 65–99)
POTASSIUM: 3.5 mmol/L (ref 3.5–5.1)
Sodium: 133 mmol/L — ABNORMAL LOW (ref 135–145)
TOTAL PROTEIN: 6.8 g/dL (ref 6.5–8.1)

## 2016-06-14 LAB — URINALYSIS, ROUTINE W REFLEX MICROSCOPIC
BILIRUBIN URINE: NEGATIVE
Glucose, UA: NEGATIVE mg/dL
Hgb urine dipstick: NEGATIVE
KETONES UR: NEGATIVE mg/dL
Nitrite: NEGATIVE
PH: 6 (ref 5.0–8.0)
PROTEIN: NEGATIVE mg/dL
Specific Gravity, Urine: 1.009 (ref 1.005–1.030)

## 2016-06-14 LAB — I-STAT CG4 LACTIC ACID, ED: Lactic Acid, Venous: 1.11 mmol/L (ref 0.5–1.9)

## 2016-06-14 MED ORDER — CIPROFLOXACIN HCL 500 MG PO TABS: 500.0000 mg | ORAL_TABLET | Freq: Two times a day (BID) | ORAL | 0 refills | Status: DC

## 2016-06-14 MED ORDER — SODIUM CHLORIDE 0.9 % IV BOLUS (SEPSIS)
500.0000 mL | Freq: Once | INTRAVENOUS | Status: AC
  Administered 2016-06-14: 500 mL via INTRAVENOUS

## 2016-06-14 MED ORDER — DEXTROSE 5 % IV SOLN
1.0000 g | Freq: Once | INTRAVENOUS | Status: AC
  Administered 2016-06-14: 1 g via INTRAVENOUS
  Filled 2016-06-14: qty 10

## 2016-06-14 NOTE — ED Triage Notes (Signed)
Per EMS- patient has a hisory of dementia. Patient's daughter reports urinary retention and foul odor. Patient has a history of UTI's. Patient's daughter called [facility] and was told to come to the ED. Patient's daughter also reported that the patient ws more confused and has been having visual hallucinations.

## 2016-06-14 NOTE — ED Provider Notes (Signed)
[facility]-EMERGENCY DEPT Provider Note   CSN: [id number] Arrival date & time: 06/14/16  1721     History   Chief Complaint Chief Complaint  Patient presents with  . Urinary Tract Infection    HPI [place] is a 83 y.o. female.  Patient is a 83 year old female with a history of hypertension, hyperlipidemia, prior PE and dementia. She lives at home but has a caregiver during the day and her daughter lives with her at night. She is confused at baseline and history is obtained through the daughter. Her daughter states that over the last few days she's had increasing confusion. She seemed to be having some hallucinations. She's not eating or drinking as much as she normally does and she has decreasing urination. There is also a bit of an odor to her urine. The daughter's concern that she may have a UTI. She also has a little bit of cough and chest congestion. No known fevers. No vomiting. No diarrhea. She hasn't been complaining of abdominal pain. She hasn't complained of any chest pain or shortness of breath.      Past Medical History:  Diagnosis Date  . Hypercholesteremia   . Hypertension   . Memory impairment   . Osteoporosis   . Pulmonary emboli (HCC)   . Thyroid disease     Patient Active Problem List   Diagnosis Date Noted  . Protein-calorie malnutrition, severe (HCC) 01/13/2015  . Syncope due to orthostatic hypotension 01/11/2015  . Orthostatic hypotension 01/11/2015  . Volume depletion 01/11/2015  . Hypovolemia dehydration 01/11/2015  . Lesion of thyroid gland 01/11/2015  . Pulmonary emboli not on AC 2/2 to risk falls 01/11/2015  . 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 Hypertension goal BP (blood pressure) < 140/90 01/11/2015    Past Surgical History:  Procedure Laterality Date  . ABDOMINAL HYSTERECTOMY    . CHOLECYSTECTOMY    . PARATHYROIDECTOMY      OB History    No data available       Home Medications    Prior to  Admission medications   Medication Sig Start Date End Date Taking? Authorizing Provider  aspirin 81 MG tablet Take 81 mg by mouth daily.   Yes Historical Provider, MD  calcium citrate-vitamin D (CITRACAL+D) 315-200 MG-UNIT tablet Take 1 tablet by mouth 2 (two) times daily.   Yes Historical Provider, MD  donepezil (ARICEPT) 10 MG tablet Take 10 mg by mouth 2 (two) times daily.    Yes Historical Provider, MD  escitalopram (LEXAPRO) 20 MG tablet Take 10 mg by mouth at bedtime.  10/05/15  Yes Historical Provider, MD  gemfibrozil (LOPID) 600 MG tablet Take 600 mg by mouth 2 (two) times daily before a meal.   Yes Historical Provider, MD  levothyroxine (SYNTHROID, LEVOTHROID) 50 MCG tablet Take 50 mcg by mouth daily.   Yes Historical Provider, MD  memantine (NAMENDA XR) 28 MG CP24 24 hr capsule Take 28 mg by mouth at bedtime.   Yes Historical Provider, MD  multivitamin-lutein (OCUVITE-LUTEIN) CAPS Take 2 capsules by mouth daily.   Yes Historical Provider, MD  omeprazole (PRILOSEC) 20 MG capsule Take 20 mg by mouth at bedtime.    Yes Historical Provider, MD  potassium chloride (K-DUR) 10 MEQ tablet Take 10 mEq by mouth 3 (three) times a week.   Yes Historical Provider, MD  triamcinolone cream (KENALOG) 0.1 % Apply 1 application topically 2 (two) times daily as needed (for rash).    Yes Historical  Provider, MD  vitamin B-12 (CYANOCOBALAMIN) 500 MCG tablet Take 500 mcg by mouth at bedtime.    Yes Historical Provider, MD  Vitamin D, Ergocalciferol, (DRISDOL) 50000 UNITS CAPS Take 50,000 Units by mouth every Sunday.    Yes Historical Provider, MD  ciprofloxacin (CIPRO) 500 MG tablet Take 1 tablet (500 mg total) by mouth 2 (two) times daily. One po bid x 7 days 06/14/16   Rolan BuccoMelanie Caterra Ostroff, MD  potassium chloride 20 MEQ/15ML (10%) SOLN Take 30 mLs (40 mEq total) by mouth 2 (two) times daily. Patient not taking: Reported on 06/14/2016 01/14/15   Rhetta MuraJai-Gurmukh Samtani, MD    Family History Family History  Problem Relation  Age of Onset  . Diabetes Mellitus II Mother     Social History Social History  Substance Use Topics  . Smoking status: Never Smoker  . Smokeless tobacco: Never Used  . Alcohol use No     Allergies   Boniva [ibandronic acid]; Lipitor [atorvastatin]; and Nsaids   Review of Systems Review of Systems  Unable to perform ROS: Dementia     Physical Exam Updated Vital Signs BP (!) 140/46 (BP Location: Right Arm)   Pulse 65   Temp 98.1 F (36.7 C) (Oral)   Resp 18   Ht 5\' 3"  (1.6 m)   Wt 134 lb (60.8 kg)   SpO2 93%   BMI 23.74 kg/m   Physical Exam  Constitutional: She appears well-developed and well-nourished.  HENT:  Head: Normocephalic and atraumatic.  Slightly dry mucous membranes  Eyes: Pupils are equal, round, and reactive to light.  Neck: Normal range of motion. Neck supple.  Cardiovascular: Normal rate, regular rhythm and normal heart sounds.   Pulmonary/Chest: Effort normal and breath sounds normal. No respiratory distress. She has no wheezes. She has no rales. She exhibits no tenderness.  Abdominal: Soft. Bowel sounds are normal. There is no tenderness. There is no rebound and no guarding.  Musculoskeletal: Normal range of motion. She exhibits no edema.  Lymphadenopathy:    She has no cervical adenopathy.  Neurological: She is alert.  Patient is alert but confused. She moves all actually symmetrically without obvious focal deficits.  Skin: Skin is warm and dry. No rash noted.  Psychiatric: She has a normal mood and affect.     ED Treatments / Results  Labs (all labs ordered are listed, but only abnormal results are displayed) Labs Reviewed  COMPREHENSIVE METABOLIC PANEL - Abnormal; Notable for the following:       Result Value   Sodium 133 (*)    Chloride 96 (*)    Glucose, Bld 115 (*)    Calcium 8.3 (*)    Albumin 2.6 (*)    ALT 12 (*)    All other components within normal limits  CBC WITH DIFFERENTIAL/PLATELET - Abnormal; Notable for the  following:    RBC 3.77 (*)    Hemoglobin 10.7 (*)    HCT 31.7 (*)    All other components within normal limits  URINALYSIS, ROUTINE W REFLEX MICROSCOPIC - Abnormal; Notable for the following:    Leukocytes, UA MODERATE (*)    Bacteria, UA RARE (*)    Squamous Epithelial / LPF 0-5 (*)    All other components within normal limits  URINE CULTURE  I-STAT CG4 LACTIC ACID, ED    EKG  EKG Interpretation None       Radiology Dg Chest 2 View  Result Date: 06/14/2016 CLINICAL DATA:  Altered mental status EXAM: CHEST  2  VIEW COMPARISON:  01/11/2015 FINDINGS: Minimal atelectasis at the left lung base. No consolidation or effusion. Stable borderline cardiomegaly. Atherosclerosis. No pneumothorax. Surgical clips at the base of the neck. Stable moderate compression deformity of lower thoracic vertebra. IMPRESSION: Minimal basilar atelectasis on the left. No acute consolidation or infiltrate. Electronically Signed   By: Jasmine Pang M.D.   On: 06/14/2016 19:21    Procedures Procedures (including critical care time)  Medications Ordered in ED Medications  sodium chloride 0.9 % bolus 500 mL (500 mLs Intravenous New Bag/Given 06/14/16 2054)  cefTRIAXone (ROCEPHIN) 1 g in dextrose 5 % 50 mL IVPB (1 g Intravenous New Bag/Given 06/14/16 2054)     Initial Impression / Assessment and Plan / ED Course  I have reviewed the triage vital signs and the nursing notes.  Pertinent labs & imaging results that were available during my care of the patient were reviewed by me and considered in my medical decision making (see chart for details).  Clinical Course     Patient is a 83 year old female with mild increased confusion and hallucinations.  Patient does have evidence of a urinary tract infection. Her other labs are non-concerning. Chest x-ray is negative for infection. There is no evidence of sepsis. Patient's vital signs are stable. The daughter states that she is currently at her baseline mental status  and has not had any hallucinations today. I feel that she can be treated as an outpatient. There's been no vomiting or fevers. She was given dose Rocephin in the ED. She'll be started on Cipro. A urine culture was sent. She was advised to follow-up with her PCP. Return precautions were given.  Final Clinical Impressions(s) / ED Diagnoses   Final diagnoses:  Urinary tract infection without hematuria, site unspecified    New Prescriptions New Prescriptions   CIPROFLOXACIN (CIPRO) 500 MG TABLET    Take 1 tablet (500 mg total) by mouth 2 (two) times daily. One po bid x 7 days     Rolan Bucco, MD 06/14/16 2150

## 2016-06-16 LAB — URINE CULTURE

## 2016-07-26 ENCOUNTER — Encounter: Payer: Self-pay | Admitting: Podiatry

## 2016-07-26 ENCOUNTER — Ambulatory Visit (INDEPENDENT_AMBULATORY_CARE_PROVIDER_SITE_OTHER): Payer: Medicare Other | Admitting: Podiatry

## 2016-07-26 DIAGNOSIS — L603 Nail dystrophy: Secondary | ICD-10-CM | POA: Diagnosis not present

## 2016-07-27 NOTE — Progress Notes (Signed)
Subjective: Gail Schneider Since the office today with her daughter for her left toenail being severely thickened with causing pain. This has been ongoing for several years and she. She had an ingrown toenail procedure as well as individual being cut. The nail has become more thickened discolored and painful. She has noticed a small amount of pinkness around the toenail denies any drainage or pus or any red streaks. Denies any systemic complaints such as fevers, chills, nausea, vomiting. No acute changes since last appointment, and no other complaints at this time.   Objective: NAD DP/PT pulses palpable bilaterally, CRT less than 3 seconds The left hallux toenail is severely hypertrophic, dystrophic, discolored with yellow discoloration. There is tenderness and left toenail. The toenails brittle. There is no swelling erythema but there is pain discoloration to the skin. There is no drainage or pus in there is no ascending cellulitis. Upon debridement there is no underlying ulceration, drainage or any signs of infection. The skin discoloration is likely from inflammation due to the severe thickening. No edema, erythema, increase in warmth to bilateral lower extremities.  No open lesions or pre-ulcerative lesions.  No pain with calf compression, swelling, warmth, erythema  Assessment: Left hallux severe onychodystrophy/onychmycosis  Plan: -All treatment options discussed with the patient including all alternatives, risks, complications.  -I debrided the left hallux toenail today without any complications or bleeding. Almost the entire toenail did come off during debridement. No signs of infection otherwise. Moistuizer to the nail bed daily but not between the toes. After debridement her daughter states that it looks much better. There was no longer any pain either.  -RTC in 3 months to prevent this from occurring again.  -Patient encouraged to call the office with any questions, concerns, change in  symptoms.   Ovid CurdMatthew Candy Leverett, DPM

## 2016-10-22 ENCOUNTER — Ambulatory Visit: Payer: Medicare Other | Admitting: Podiatry

## 2016-10-29 ENCOUNTER — Ambulatory Visit (INDEPENDENT_AMBULATORY_CARE_PROVIDER_SITE_OTHER): Payer: Medicare Other | Admitting: Podiatry

## 2016-10-29 ENCOUNTER — Encounter: Payer: Self-pay | Admitting: Podiatry

## 2016-10-29 DIAGNOSIS — M79675 Pain in left toe(s): Secondary | ICD-10-CM

## 2016-10-29 DIAGNOSIS — L603 Nail dystrophy: Secondary | ICD-10-CM

## 2016-11-02 NOTE — Progress Notes (Signed)
Subjective: Ms. Fredric MareBailey Since the office today with her daughter for her left toenail being severely thickened with causing pain which has started to come back. She also has a history of eczema and her daughter is putting medication on the toe for this. Denies any systemic complaints such as fevers, chills, nausea, vomiting. No acute changes since last appointment, and no other complaints at this time.   Objective: NAD DP/PT pulses palpable bilaterally, CRT less than 3 seconds The left hallux toenail is severely hypertrophic, dystrophic, discolored with yellow discoloration. There is tenderness and left toenail. The toenail is very brittle. There is no surrounding erythema, ascending cellulitis or other signs of infection. Upon debridement there is no underlying ulceration, drainage or any signs of infection. The skin discoloration is likely from inflammation and eczema.  No edema, erythema, increase in warmth to bilateral lower extremities.  No open lesions or pre-ulcerative lesions.  No pain with calf compression, swelling, warmth, erythema  Assessment: Left hallux severe onychodystrophy/onychmycosis  Plan: -All treatment options discussed with the patient including all alternatives, risks, complications.  -I debrided the left hallux toenail today without any complications or bleeding. Monitor closely for any skin breakdown ulceration or any other signs of infection. Patient's daughter for understood. -RTC in 3 months to prevent this from occurring again or sooner if any issues are to arise. -Patient encouraged to call the office with any questions, concerns, change in symptoms.   Ovid CurdMatthew Wagoner, DPM

## 2016-11-26 ENCOUNTER — Encounter: Payer: Self-pay | Admitting: Podiatry

## 2016-11-26 ENCOUNTER — Ambulatory Visit (INDEPENDENT_AMBULATORY_CARE_PROVIDER_SITE_OTHER): Payer: Medicare Other | Admitting: Podiatry

## 2016-11-26 ENCOUNTER — Telehealth: Payer: Self-pay | Admitting: Podiatry

## 2016-11-26 DIAGNOSIS — L03032 Cellulitis of left toe: Secondary | ICD-10-CM

## 2016-11-26 DIAGNOSIS — L02612 Cutaneous abscess of left foot: Secondary | ICD-10-CM | POA: Diagnosis not present

## 2016-11-26 NOTE — Telephone Encounter (Signed)
Called patient's daughter, Dindy, back at [phone] to answer question for the antibiotic that her mother is taking right now. Pt is on Cefdinir 300 mg bid for 5 days. I told patient that "Dr. Ardelle AntonWagoner said that her mother does not need any additional antibiotics or medications for the infection in the toe. She can just take the antibiotic that she is on now and that is fine for the toes as well". Dindy stated she understood.

## 2016-11-28 NOTE — Progress Notes (Signed)
Subjective: 83 year old female presents the office today with her daughter for concerns of her left big toe, possible infection. Her daughter states that she has noticed an area just behind the toenail that had a small opening in the skin as well as some pus. This is been ongoing for last couple of days. She was diagnosed with a UTI today and she was given antibiotics for this.Her daughter denies any recent injury or trauma. Denies any systemic complaints such as fevers, chills, nausea, vomiting. No acute changes since last appointment, and no other complaints at this time.   Objective: NAD DP/PT pulses palpable bilaterally, CRT less than 3 seconds The left hallux toenail appears to be significantly dystrophic, discolored and hypertrophic. Upon debridement I was unable to identify any area of wound underneath the toenail and there was no drainage or pus coming from the toenail itself. However proximal to the nail just distal to the IPJ is a small opening in the skin measuring approximately 0.2 x 0.2 x 0.2 cm. I was only able to express a small amount of bloody drainage but there is no pus however her daughter states that she did have pus to this area. There is mild edema erythema to the hallux and there is no ascending cellulitis. There is no fluctuance, crepitus, malodor. No open lesions or pre-ulcerative lesions.  No pain with calf compression, swelling, warmth, erythema  Assessment: Left hallux cellulitis, ulceration  Plan: -All treatment options discussed with the patient including all alternatives, risks, complications.  -Debrided the area today with any complications or bleeding to the toenail.debridement of the wound. Recommended a small amount antibiotic ointment and a bandage daily. She was recently started on antibiotics and I called the primary care physician and she was started on cephalosporin which should be adequate for this as well. -Monitor for any clinical signs or symptoms of  infection and directed to call the office immediately should any occur or go to the ER. -RTC 1 week -Patient encouraged to call the office with any questions, concerns, change in symptoms.   Ovid CurdMatthew Wagoner, DPM

## 2017-10-25 IMAGING — CR DG CHEST 2V
2 series · 2 of 2 positions shown · non-contrast
Comparison: 01/11/2015

CLINICAL DATA: Altered mental status

EXAM:
CHEST  2 VIEW

[w chest lat]
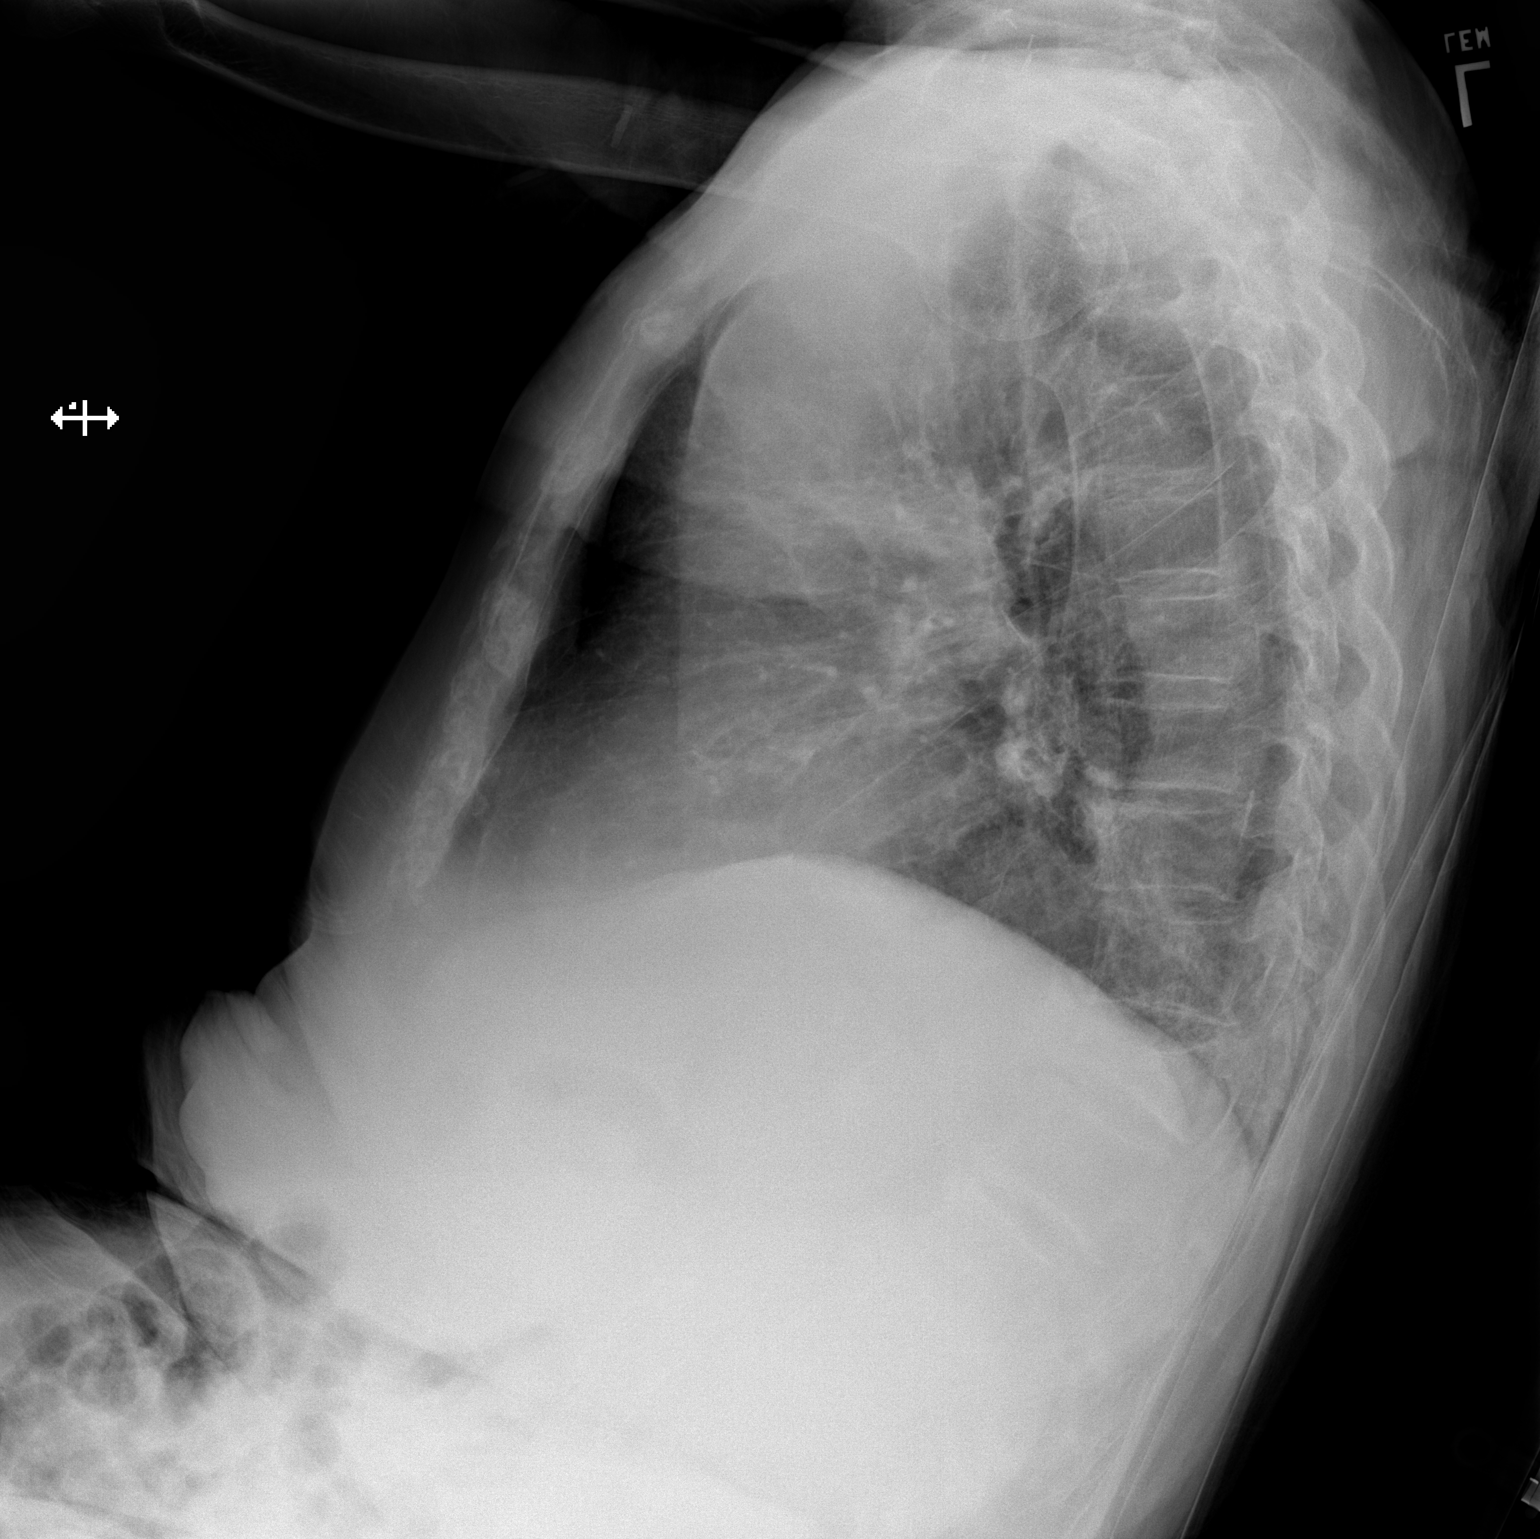

[x chest ap]
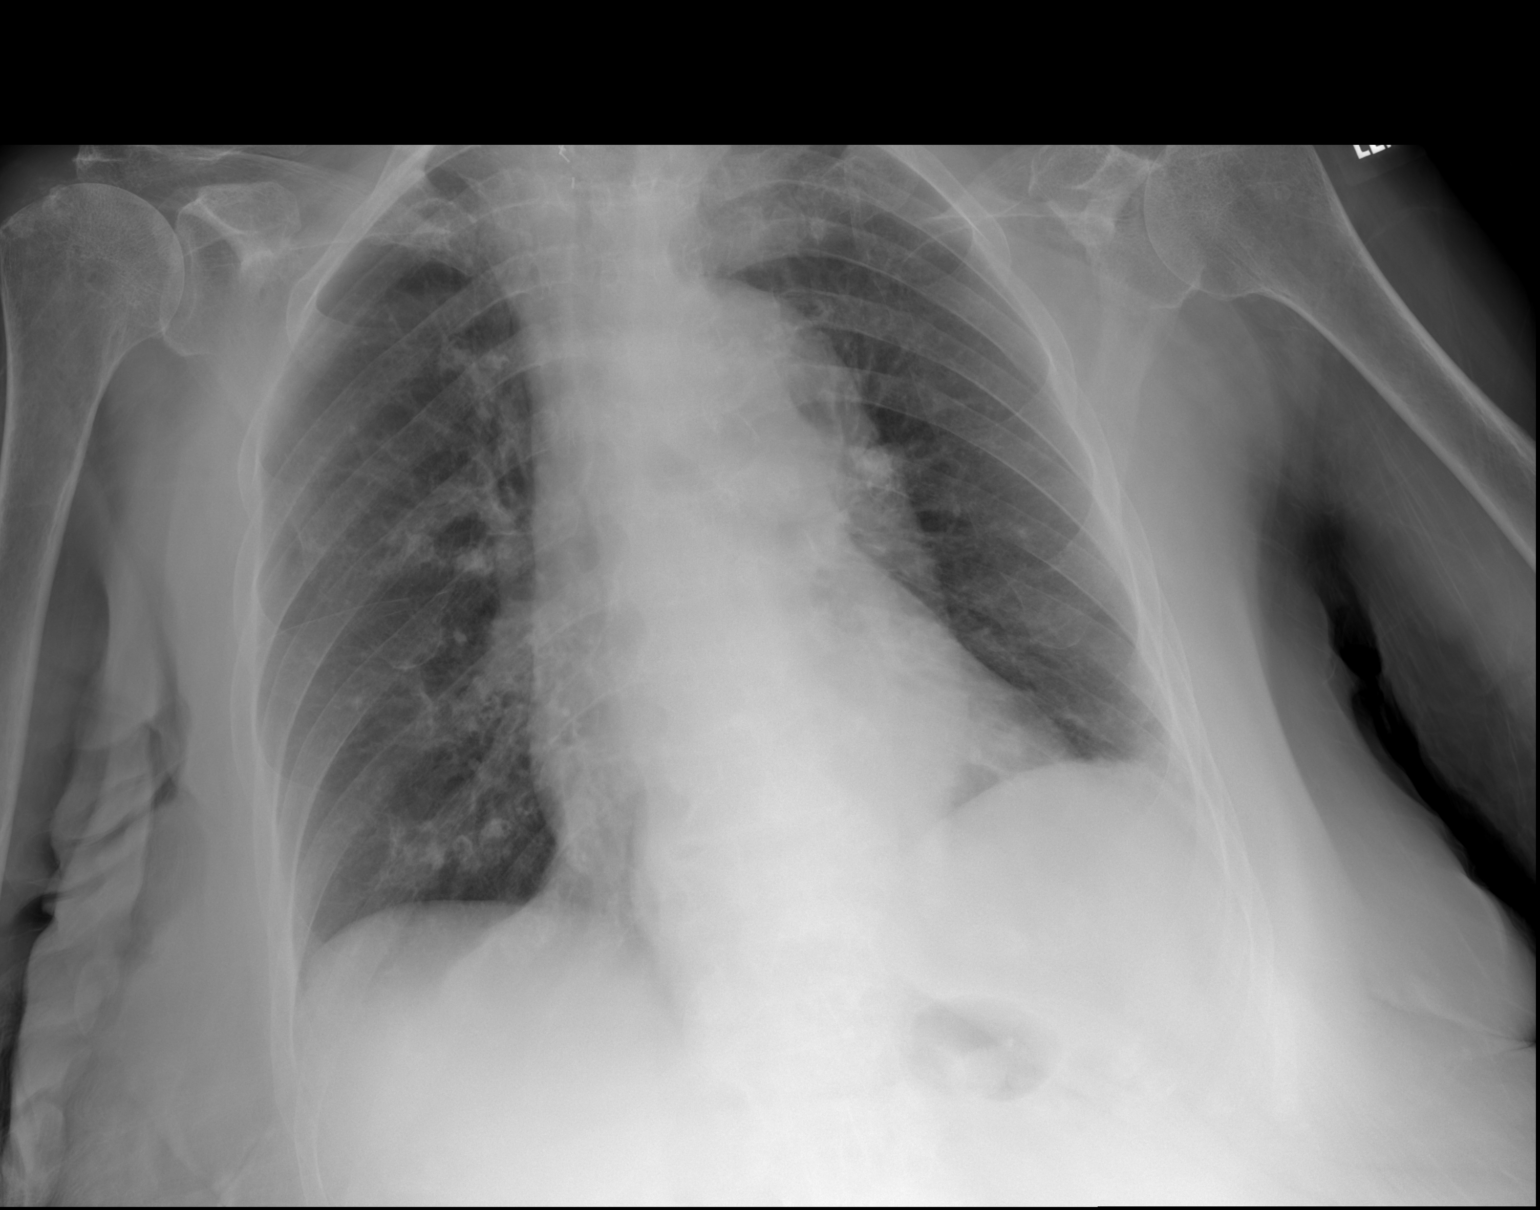

[2 of 2 positions shown; findings below may reference images not displayed]

FINDINGS: Minimal atelectasis at the left lung base. No consolidation or
effusion. Stable borderline cardiomegaly. Atherosclerosis. No
pneumothorax. Surgical clips at the base of the neck. Stable
moderate compression deformity of lower thoracic vertebra.
IMPRESSION: Minimal basilar atelectasis on the left. No acute consolidation or
infiltrate.

## 2017-11-05 DEATH — deceased
